# Patient Record
Sex: Female | Born: 1935 | Race: White | Hispanic: No | Smoking: Never smoker
Health system: Southern US, Community
[De-identification: ages and names within clinical notes are randomized; demographics above are authoritative.]

## PROBLEM LIST (undated history)

## (undated) DIAGNOSIS — N6489 Other specified disorders of breast: Secondary | ICD-10-CM

## (undated) DIAGNOSIS — IMO0002 Reserved for concepts with insufficient information to code with codable children: Secondary | ICD-10-CM

## (undated) DIAGNOSIS — E039 Hypothyroidism, unspecified: Secondary | ICD-10-CM

## (undated) DIAGNOSIS — K219 Gastro-esophageal reflux disease without esophagitis: Secondary | ICD-10-CM

## (undated) DIAGNOSIS — Z4689 Encounter for fitting and adjustment of other specified devices: Secondary | ICD-10-CM

## (undated) DIAGNOSIS — F419 Anxiety disorder, unspecified: Secondary | ICD-10-CM

## (undated) DIAGNOSIS — N816 Rectocele: Secondary | ICD-10-CM

## (undated) DIAGNOSIS — G20 Parkinson's disease: Secondary | ICD-10-CM

## (undated) DIAGNOSIS — M1990 Unspecified osteoarthritis, unspecified site: Secondary | ICD-10-CM

## (undated) DIAGNOSIS — G309 Alzheimer's disease, unspecified: Secondary | ICD-10-CM

## (undated) DIAGNOSIS — G20A1 Parkinson's disease without dyskinesia, without mention of fluctuations: Secondary | ICD-10-CM

## (undated) DIAGNOSIS — N3289 Other specified disorders of bladder: Secondary | ICD-10-CM

## (undated) DIAGNOSIS — M5030 Other cervical disc degeneration, unspecified cervical region: Secondary | ICD-10-CM

## (undated) DIAGNOSIS — Z87440 Personal history of urinary (tract) infections: Secondary | ICD-10-CM

## (undated) DIAGNOSIS — F0390 Unspecified dementia without behavioral disturbance: Secondary | ICD-10-CM

## (undated) DIAGNOSIS — D329 Benign neoplasm of meninges, unspecified: Secondary | ICD-10-CM

## (undated) DIAGNOSIS — R319 Hematuria, unspecified: Secondary | ICD-10-CM

## (undated) DIAGNOSIS — M810 Age-related osteoporosis without current pathological fracture: Secondary | ICD-10-CM

## (undated) DIAGNOSIS — I10 Essential (primary) hypertension: Secondary | ICD-10-CM

## (undated) DIAGNOSIS — D649 Anemia, unspecified: Secondary | ICD-10-CM

## (undated) DIAGNOSIS — F0280 Dementia in other diseases classified elsewhere without behavioral disturbance: Secondary | ICD-10-CM

## (undated) DIAGNOSIS — N952 Postmenopausal atrophic vaginitis: Secondary | ICD-10-CM

## (undated) HISTORY — DX: Essential (primary) hypertension: I10

## (undated) HISTORY — DX: Personal history of urinary (tract) infections: Z87.440

## (undated) HISTORY — DX: Rectocele: N81.6

## (undated) HISTORY — DX: Other specified disorders of bladder: N32.89

## (undated) HISTORY — DX: Other specified disorders of breast: N64.89

## (undated) HISTORY — DX: Unspecified osteoarthritis, unspecified site: M19.90

## (undated) HISTORY — DX: Encounter for fitting and adjustment of other specified devices: Z46.89

## (undated) HISTORY — DX: Gastro-esophageal reflux disease without esophagitis: K21.9

## (undated) HISTORY — PX: OTHER SURGICAL HISTORY: SHX169

## (undated) HISTORY — PX: TOTAL THYROIDECTOMY: SHX2547

## (undated) HISTORY — DX: Parkinson's disease: G20

## (undated) HISTORY — DX: Parkinson's disease without dyskinesia, without mention of fluctuations: G20.A1

## (undated) HISTORY — DX: Reserved for concepts with insufficient information to code with codable children: IMO0002

## (undated) HISTORY — DX: Postmenopausal atrophic vaginitis: N95.2

## (undated) HISTORY — PX: ABDOMINAL HYSTERECTOMY: SHX81

## (undated) HISTORY — PX: CARPAL TUNNEL RELEASE: SHX101

## (undated) HISTORY — DX: Hematuria, unspecified: R31.9

## (undated) HISTORY — DX: Anxiety disorder, unspecified: F41.9

## (undated) HISTORY — DX: Age-related osteoporosis without current pathological fracture: M81.0

---

## 2003-03-13 ENCOUNTER — Other Ambulatory Visit: Payer: Self-pay

## 2004-02-22 ENCOUNTER — Ambulatory Visit: Payer: Self-pay | Admitting: Internal Medicine

## 2004-10-10 ENCOUNTER — Ambulatory Visit: Payer: Self-pay | Admitting: Urology

## 2004-10-16 ENCOUNTER — Ambulatory Visit: Payer: Self-pay | Admitting: Gastroenterology

## 2004-10-18 ENCOUNTER — Ambulatory Visit: Payer: Self-pay | Admitting: Obstetrics and Gynecology

## 2004-11-07 ENCOUNTER — Inpatient Hospital Stay: Payer: Self-pay | Admitting: Obstetrics and Gynecology

## 2004-12-28 ENCOUNTER — Ambulatory Visit: Payer: Self-pay | Admitting: Neurology

## 2005-03-06 ENCOUNTER — Ambulatory Visit: Payer: Self-pay | Admitting: Internal Medicine

## 2005-03-13 ENCOUNTER — Ambulatory Visit: Payer: Self-pay | Admitting: Internal Medicine

## 2005-08-06 ENCOUNTER — Ambulatory Visit: Payer: Self-pay | Admitting: Cardiology

## 2006-03-12 ENCOUNTER — Ambulatory Visit: Payer: Self-pay | Admitting: Internal Medicine

## 2006-05-07 ENCOUNTER — Ambulatory Visit: Payer: Self-pay | Admitting: Internal Medicine

## 2006-05-12 ENCOUNTER — Ambulatory Visit: Payer: Self-pay | Admitting: Internal Medicine

## 2006-12-01 ENCOUNTER — Emergency Department: Payer: Self-pay | Admitting: Emergency Medicine

## 2007-01-11 ENCOUNTER — Emergency Department: Payer: Self-pay | Admitting: Emergency Medicine

## 2007-03-17 ENCOUNTER — Ambulatory Visit: Payer: Self-pay | Admitting: Internal Medicine

## 2008-03-17 ENCOUNTER — Ambulatory Visit: Payer: Self-pay | Admitting: Internal Medicine

## 2009-03-20 ENCOUNTER — Ambulatory Visit: Payer: Self-pay | Admitting: Internal Medicine

## 2010-03-12 ENCOUNTER — Other Ambulatory Visit: Payer: Self-pay | Admitting: Neurology

## 2010-03-13 ENCOUNTER — Ambulatory Visit: Payer: Self-pay | Admitting: Neurology

## 2010-03-22 ENCOUNTER — Ambulatory Visit: Payer: Self-pay | Admitting: Internal Medicine

## 2010-03-28 ENCOUNTER — Ambulatory Visit: Payer: Self-pay | Admitting: Ophthalmology

## 2010-04-18 ENCOUNTER — Ambulatory Visit: Payer: Self-pay | Admitting: Ophthalmology

## 2010-11-02 ENCOUNTER — Ambulatory Visit: Payer: Self-pay | Admitting: Internal Medicine

## 2011-03-25 ENCOUNTER — Ambulatory Visit: Payer: Self-pay | Admitting: Internal Medicine

## 2011-05-14 ENCOUNTER — Ambulatory Visit: Payer: Self-pay | Admitting: Internal Medicine

## 2012-03-03 ENCOUNTER — Ambulatory Visit: Payer: Self-pay | Admitting: Internal Medicine

## 2012-11-26 ENCOUNTER — Ambulatory Visit: Payer: Self-pay | Admitting: Neurology

## 2012-11-26 LAB — CREATININE, SERUM: EGFR (Non-African Amer.): 46 — ABNORMAL LOW

## 2013-04-06 ENCOUNTER — Ambulatory Visit: Payer: Self-pay | Admitting: Family Medicine

## 2013-09-03 DIAGNOSIS — K219 Gastro-esophageal reflux disease without esophagitis: Secondary | ICD-10-CM | POA: Insufficient documentation

## 2013-09-03 DIAGNOSIS — E039 Hypothyroidism, unspecified: Secondary | ICD-10-CM | POA: Insufficient documentation

## 2013-09-03 DIAGNOSIS — I10 Essential (primary) hypertension: Secondary | ICD-10-CM | POA: Insufficient documentation

## 2013-09-03 DIAGNOSIS — E785 Hyperlipidemia, unspecified: Secondary | ICD-10-CM | POA: Insufficient documentation

## 2013-09-10 DIAGNOSIS — G20 Parkinson's disease: Secondary | ICD-10-CM | POA: Insufficient documentation

## 2013-09-10 DIAGNOSIS — R9402 Abnormal brain scan: Secondary | ICD-10-CM | POA: Insufficient documentation

## 2013-09-10 DIAGNOSIS — R413 Other amnesia: Secondary | ICD-10-CM | POA: Insufficient documentation

## 2013-09-10 DIAGNOSIS — R262 Difficulty in walking, not elsewhere classified: Secondary | ICD-10-CM | POA: Insufficient documentation

## 2014-05-02 ENCOUNTER — Ambulatory Visit: Payer: Self-pay | Admitting: Family Medicine

## 2014-09-30 ENCOUNTER — Other Ambulatory Visit: Payer: Self-pay

## 2014-09-30 ENCOUNTER — Emergency Department: Payer: Medicare Other

## 2014-09-30 ENCOUNTER — Encounter: Payer: Self-pay | Admitting: Emergency Medicine

## 2014-09-30 ENCOUNTER — Emergency Department
Admission: EM | Admit: 2014-09-30 | Discharge: 2014-09-30 | Disposition: A | Discharge status: Home / Self Care | Payer: Medicare Other | Attending: Emergency Medicine | Admitting: Emergency Medicine

## 2014-09-30 DIAGNOSIS — Z79899 Other long term (current) drug therapy: Secondary | ICD-10-CM | POA: Diagnosis not present

## 2014-09-30 DIAGNOSIS — Y998 Other external cause status: Secondary | ICD-10-CM | POA: Diagnosis not present

## 2014-09-30 DIAGNOSIS — I10 Essential (primary) hypertension: Secondary | ICD-10-CM | POA: Insufficient documentation

## 2014-09-30 DIAGNOSIS — S6991XA Unspecified injury of right wrist, hand and finger(s), initial encounter: Secondary | ICD-10-CM | POA: Diagnosis not present

## 2014-09-30 DIAGNOSIS — S0990XA Unspecified injury of head, initial encounter: Secondary | ICD-10-CM | POA: Insufficient documentation

## 2014-09-30 DIAGNOSIS — Z7982 Long term (current) use of aspirin: Secondary | ICD-10-CM | POA: Diagnosis not present

## 2014-09-30 DIAGNOSIS — R4182 Altered mental status, unspecified: Secondary | ICD-10-CM | POA: Diagnosis present

## 2014-09-30 DIAGNOSIS — G20 Parkinson's disease: Secondary | ICD-10-CM | POA: Diagnosis not present

## 2014-09-30 DIAGNOSIS — W01198A Fall on same level from slipping, tripping and stumbling with subsequent striking against other object, initial encounter: Secondary | ICD-10-CM | POA: Diagnosis not present

## 2014-09-30 DIAGNOSIS — Y9389 Activity, other specified: Secondary | ICD-10-CM | POA: Diagnosis not present

## 2014-09-30 DIAGNOSIS — F0390 Unspecified dementia without behavioral disturbance: Secondary | ICD-10-CM | POA: Diagnosis not present

## 2014-09-30 DIAGNOSIS — Y9289 Other specified places as the place of occurrence of the external cause: Secondary | ICD-10-CM | POA: Diagnosis not present

## 2014-09-30 LAB — COMPREHENSIVE METABOLIC PANEL
ALT: 5 U/L — AB (ref 14–54)
AST: 18 U/L (ref 15–41)
Albumin: 3.8 g/dL (ref 3.5–5.0)
Alkaline Phosphatase: 50 U/L (ref 38–126)
Anion gap: 7 (ref 5–15)
BUN: 28 mg/dL — ABNORMAL HIGH (ref 6–20)
CHLORIDE: 101 mmol/L (ref 101–111)
CO2: 27 mmol/L (ref 22–32)
Calcium: 9 mg/dL (ref 8.9–10.3)
Creatinine, Ser: 1.07 mg/dL — ABNORMAL HIGH (ref 0.44–1.00)
GFR calc Af Amer: 53 mL/min — ABNORMAL LOW (ref >60)
GFR calc non Af Amer: 45 mL/min — ABNORMAL LOW (ref >60)
Glucose, Bld: 95 mg/dL (ref 65–99)
POTASSIUM: 4 mmol/L (ref 3.5–5.1)
SODIUM: 135 mmol/L (ref 135–145)
Total Bilirubin: 0.4 mg/dL (ref 0.3–1.2)
Total Protein: 7.1 g/dL (ref 6.5–8.1)

## 2014-09-30 LAB — URINALYSIS COMPLETE WITH MICROSCOPIC (ARMC ONLY)
Bacteria, UA: NONE SEEN
Bilirubin Urine: NEGATIVE
GLUCOSE, UA: NEGATIVE mg/dL
Hgb urine dipstick: NEGATIVE
Ketones, ur: NEGATIVE mg/dL
Nitrite: NEGATIVE
Protein, ur: NEGATIVE mg/dL
RBC / HPF: NONE SEEN RBC/hpf (ref 0–5)
Specific Gravity, Urine: 1.013 (ref 1.005–1.030)
pH: 6 (ref 5.0–8.0)

## 2014-09-30 LAB — CBC
HCT: 36.4 % (ref 35.0–47.0)
Hemoglobin: 11.8 g/dL — ABNORMAL LOW (ref 12.0–16.0)
MCH: 32.5 pg (ref 26.0–34.0)
MCHC: 32.5 g/dL (ref 32.0–36.0)
MCV: 99.9 fL (ref 80.0–100.0)
Platelets: 268 10*3/uL (ref 150–440)
RBC: 3.64 MIL/uL — ABNORMAL LOW (ref 3.80–5.20)
RDW: 13.1 % (ref 11.5–14.5)
WBC: 8.6 10*3/uL (ref 3.6–11.0)

## 2014-09-30 NOTE — Discharge Instructions (Signed)

## 2014-09-30 NOTE — ED Notes (Signed)
When asking pt questions she laughs a lot first, p[leasant confused

## 2014-09-30 NOTE — ED Notes (Signed)
Patient transported to CT 

## 2014-09-30 NOTE — ED Provider Notes (Signed)
[facility] Emergency Department Provider Note  ____________________________________________  Time seen: 5:25 PM  I have reviewed the triage vital signs and the nursing notes.   HISTORY  Chief Complaint Fall; Arm Pain; and Altered Mental Status    HPI Tina Gilbert is a 81 y.o. female is brought to the ED by her family due to confusion. They note that she fell and hit her head about 5 days ago. They've been following up with neurology Dr. Melrose Nakayama who started the patient on Namenda due to a decrease in her Mini-Mental status exam and dementia. The patient has no new complaints the chest pain shortness of breath fever chills nausea vomiting abdominal pain dysuria frequency urgency hematuria. She is eating and drinking normally.     Past Medical History  Diagnosis Date  . Hypertension   . Osteoporosis   . GERD (gastroesophageal reflux disease)   . Anxiety   . History of UTI   . Bladder irritability   . Asymmetrical breasts   . Hematuria   . Rectocele     moderate  . Pessary maintenance   . Cystocele     gellhorn 2 1/4   . Vaginal atrophy   . Parkinson disease     There are no active problems to display for this patient.   Past Surgical History  Procedure Laterality Date  . Colon surgery      Current Outpatient Rx  Name  Route  Sig  Dispense  Refill  . ALPRAZolam (XANAX) 0.25 MG tablet   Oral   Take 0.25 mg by mouth 4 (four) times daily as needed for anxiety.          Marland Kitchen aspirin EC 81 MG tablet   Oral   Take 81 mg by mouth daily.         Marland Kitchen atenolol (TENORMIN) 50 MG tablet   Oral   Take 50 mg by mouth daily.         . carbidopa-levodopa (SINEMET CR) 50-200 MG per tablet   Oral   Take 1 tablet by mouth 2 (two) times daily.         . Cholecalciferol (VITAMIN D) 2000 UNITS tablet   Oral   Take 2,000 Units by mouth daily.         . citalopram (CELEXA) 20 MG tablet   Oral   Take 20 mg by mouth daily.         Marland Kitchen  conjugated estrogens (PREMARIN) vaginal cream   Vaginal   Place 1 Applicatorful vaginally See admin instructions. Pt uses twice a week on Tuesday and Saturday.         . donepezil (ARICEPT) 5 MG tablet   Oral   Take 5 mg by mouth daily.          Marland Kitchen levothyroxine (SYNTHROID, LEVOTHROID) 125 MCG tablet   Oral   Take 125 mcg by mouth daily.         . memantine (NAMENDA) 5 MG tablet   Oral   Take 5 mg by mouth 2 (two) times daily.         Marland Kitchen omeprazole (PRILOSEC) 20 MG capsule   Oral   Take 20 mg by mouth daily.         . Oxybutynin Chloride 10 % GEL   Transdermal   Place 1 application onto the skin daily.          Levin Erp SULFATE VAGINAL 0.025 % GEL   Vaginal  Place 1 Applicatorful vaginally once a week. Pt uses on Saturday.         . triamterene-hydrochlorothiazide (MAXZIDE-25) 37.5-25 MG per tablet   Oral   Take 0.5 tablets by mouth daily.           Allergies Avelox; Celebrex; and Tramadol  No family history on file.  Social History History  Substance Use Topics  . Smoking status: Never Smoker   . Smokeless tobacco: Not on file  . Alcohol Use: No    Review of Systems  Constitutional: No fever or chills. No weight changes Eyes:No blurry vision or double vision.  ENT: No sore throat. Cardiovascular: No chest pain. Respiratory: No dyspnea or cough. Gastrointestinal: Negative for abdominal pain, vomiting and diarrhea.  No BRBPR or melena. Genitourinary: Negative for dysuria, urinary retention, bloody urine, or difficulty urinating. Musculoskeletal: Right wrist pain after fall  Skin: Negative for rash. Neurological: Negative for headaches, focal weakness or numbness. Psychiatric:No anxiety or depression.   Endocrine:No hot/cold intolerance, changes in energy, or sleep difficulty.  10-point ROS otherwise negative.  ____________________________________________   PHYSICAL EXAM:  VITAL SIGNS: ED Triage Vitals  Enc Vitals Group      BP 09/30/14 1215 112/56 mmHg     Pulse Rate 09/30/14 1215 73     Resp 09/30/14 1215 18     Temp 09/30/14 1215 98.5 F (36.9 C)     Temp Source 09/30/14 1215 Oral     SpO2 09/30/14 1215 96 %     Weight 09/30/14 1215 160 lb (72.576 kg)     Height 09/30/14 1215 5\' 1"  (1.549 m)     Head Cir --      Peak Flow --      Pain Score 09/30/14 1945 6     Pain Loc --      Pain Edu? --      Excl. in [place]? --      Constitutional: Alert and oriented. Well appearing and in no distress. Eyes: No scleral icterus. No conjunctival pallor. PERRL. EOMI ENT   Head: Normocephalic and atraumatic.   Nose: No congestion/rhinnorhea. No septal hematoma   Mouth/Throat: MMM, no pharyngeal erythema. No peritonsillar mass. No uvula shift.   Neck: No stridor. No SubQ emphysema. No meningismus. Hematological/Lymphatic/Immunilogical: No cervical lymphadenopathy. Cardiovascular: RRR. Normal and symmetric distal pulses are present in all extremities. No murmurs, rubs, or gallops. Respiratory: Normal respiratory effort without tachypnea nor retractions. Breath sounds are clear and equal bilaterally. No wheezes/rales/rhonchi. Gastrointestinal: Soft and nontender. No distention. There is no CVA tenderness.  No rebound, rigidity, or guarding. Genitourinary: deferred Musculoskeletal: Nontender with normal range of motion in all extremities. No joint effusions.  No lower extremity tenderness.  No edema. Neurologic:   Normal speech and language. Oriented to person and place CN 2-10 normal. Motor grossly intact. No pronator drift.  Normal gait. No gross focal neurologic deficits are appreciated.  Skin:  Skin is warm, dry and intact. No rash noted.  No petechiae, purpura, or bullae. Psychiatric: Mood and affect are normal. ____________________________________________    LABS (pertinent positives/negatives) (all labs ordered are listed, but only abnormal results are displayed) Labs Reviewed  CBC - Abnormal;  Notable for the following:    RBC 3.64 (*)    Hemoglobin 11.8 (*)    All other components within normal limits  COMPREHENSIVE METABOLIC PANEL - Abnormal; Notable for the following:    BUN 28 (*)    Creatinine, Ser 1.07 (*)    ALT 5 (*)  GFR calc non Af Amer 45 (*)    GFR calc Af Amer 53 (*)    All other components within normal limits  URINALYSIS COMPLETEWITH MICROSCOPIC ([facility] ONLY) - Abnormal; Notable for the following:    Color, Urine YELLOW (*)    APPearance CLEAR (*)    Leukocytes, UA TRACE (*)    Squamous Epithelial / LPF 0-5 (*)    All other components within normal limits  CBG MONITORING, ED   ____________________________________________   EKG  Interpreted by me Normal sinus rhythm rate of 67 normal axis left bundle branch block normal ST segments and T waves  ____________________________________________    RADIOLOGY  CT head unremarkable Chest x-ray unremarkable X-ray right wrist unremarkable  ____________________________________________   PROCEDURES  ____________________________________________   INITIAL IMPRESSION / ASSESSMENT AND PLAN / ED COURSE  Pertinent labs & imaging results that were available during my care of the patient were reviewed by me and considered in my medical decision making (see chart for details).  Patient presents with confusion in the setting of chronic dementia. However, it seems that the patient's confusion is an dementia are progressing more rapidly than expected. Due to her recent fall check a CT head to rule out intracranial hematoma as well as evaluate for occult sources of infection. I was suspicion of meningitis or encephalitis. We'll check a UA chest x-ray and labs CT head and x-rays related injuries.  ----------------------------------------- 8:31 PM on 09/30/2014 -----------------------------------------  Workup unremarkable. Patient in good spirits feeling well again no complaints and hemodynamically stable. We'll  discharge home continue all home medications follow-up with Dr. Melrose Nakayama.  ____________________________________________   FINAL CLINICAL IMPRESSION(S) / ED DIAGNOSES  Final diagnoses:  Dementia, without behavioral disturbance      Carrie Mew, MD 09/30/14 2031

## 2014-09-30 NOTE — ED Notes (Signed)
[place] backwards few days ago injuring right hand, has dementia, last few days her memory has decreased.  Accidentally taking too many meds,lives at home alone, daughters in room, some neck tenderness, has had occult blood in stools, is scheduled to see GI md in a few weeks

## 2014-09-30 NOTE — ED Notes (Signed)
Pt reports fell yesterday and hurt her right wrist. Slight swelling noted. Full ROM noted, pulse present. Pt daughter reports that it was 2 days ago that she fell not yesterday. Daughter reports pt being treated for her cognitive decline over the last 6-8 months, started a new medication 2 days ago and her confusion is worse. Pt reports called PMD and was advised to come to the ED for evaluation.

## 2014-10-19 ENCOUNTER — Ambulatory Visit (INDEPENDENT_AMBULATORY_CARE_PROVIDER_SITE_OTHER): Payer: Medicare Other | Admitting: Obstetrics and Gynecology

## 2014-10-19 ENCOUNTER — Encounter: Payer: Self-pay | Admitting: Obstetrics and Gynecology

## 2014-10-19 VITALS — BP 107/66 | HR 67 | Ht 63.0 in | Wt 155.7 lb

## 2014-10-19 DIAGNOSIS — N8110 Cystocele, unspecified: Secondary | ICD-10-CM | POA: Diagnosis not present

## 2014-10-19 DIAGNOSIS — Z4689 Encounter for fitting and adjustment of other specified devices: Secondary | ICD-10-CM | POA: Diagnosis not present

## 2014-10-19 DIAGNOSIS — IMO0002 Reserved for concepts with insufficient information to code with codable children: Secondary | ICD-10-CM

## 2014-10-19 DIAGNOSIS — N816 Rectocele: Secondary | ICD-10-CM

## 2014-10-19 NOTE — Progress Notes (Signed)
Patient ID: Tina Gilbert, female   DOB: Aug 31, 1924, 81 y.o.   MRN: [id number]     GYN ENCOUNTER NOTE  Subjective:       Tina Gilbert is a 81 y.o. No obstetric history on file. female is here for gynecologic evaluation of the following issues:  1. Pessary maintenance  Pessary check lv- 09/08/2014 gellhorn 2 1/4 Vaginal d/c- pos  vaginal odor- no No vb, or vaginal pain Premarin and trimosan used weekly Pos bm qd Urination- frequent   Gynecologic History No LMP recorded. Patient has had a hysterectomy. Contraception: status post hysterectomy   Obstetric History OB History  No data available    Past Medical History  Diagnosis Date  . Hypertension   . Osteoporosis   . GERD (gastroesophageal reflux disease)   . Anxiety   . History of UTI   . Bladder irritability   . Asymmetrical breasts   . Hematuria   . Rectocele     moderate  . Pessary maintenance   . Cystocele     gellhorn 2 1/4   . Vaginal atrophy   . Parkinson disease     Past Surgical History  Procedure Laterality Date  . Colon surgery      Current Outpatient Prescriptions on File Prior to Visit  Medication Sig Dispense Refill  . ALPRAZolam (XANAX) 0.25 MG tablet Take 0.25 mg by mouth 4 (four) times daily as needed for anxiety.     Marland Kitchen aspirin EC 81 MG tablet Take 81 mg by mouth daily.    Marland Kitchen atenolol (TENORMIN) 50 MG tablet Take 50 mg by mouth daily.    . carbidopa-levodopa (SINEMET CR) 50-200 MG per tablet Take 1 tablet by mouth 2 (two) times daily.    . Cholecalciferol (VITAMIN D) 2000 UNITS tablet Take 2,000 Units by mouth daily.    . citalopram (CELEXA) 20 MG tablet Take 20 mg by mouth daily.    Marland Kitchen conjugated estrogens (PREMARIN) vaginal cream Place 1 Applicatorful vaginally See admin instructions. Pt uses twice a week on Tuesday and Saturday.    . levothyroxine (SYNTHROID, LEVOTHROID) 125 MCG tablet Take 125 mcg by mouth daily.    Marland Kitchen omeprazole (PRILOSEC) 20 MG capsule Take 20 mg by mouth daily.     Levin Erp SULFATE VAGINAL 0.025 % GEL Place 1 Applicatorful vaginally once a week. Pt uses on Saturday.    . triamterene-hydrochlorothiazide (MAXZIDE-25) 37.5-25 MG per tablet Take 0.5 tablets by mouth daily.     No current facility-administered medications on file prior to visit.    Allergies  Allergen Reactions  . Avelox [Moxifloxacin Hcl In Nacl] Other (See Comments)    Reaction:  Positive ANA and bilateral iritis   . Celebrex [Celecoxib] Other (See Comments)    Reaction:  Positive ANA and bilateral iritis   . Memantine Hcl Other (See Comments)    confusion  . Tramadol Other (See Comments)    Reaction:  Hallucinations     History   Social History  . Marital Status: Widowed    Spouse Name: N/A  . Number of Children: N/A  . Years of Education: N/A   Occupational History  . Not on file.   Social History Main Topics  . Smoking status: Never Smoker   . Smokeless tobacco: Not on file  . Alcohol Use: No  . Drug Use: No  . Sexual Activity: No   Other Topics Concern  . Not on file   Social History Narrative    History  reviewed. No pertinent family history.  The following portions of the patient's history were reviewed and updated as appropriate: allergies, current medications, past family history, past medical history, past social history, past surgical history and problem list.  Review of Systems Review of Systems - General ROS: negative for - chills, fatigue, fever, hot flashes, malaise or night sweats Hematological and Lymphatic ROS: negative for - bleeding problems or swollen lymph nodes Gastrointestinal ROS: negative for - abdominal pain, blood in stools, change in bowel habits and nausea/vomiting Musculoskeletal ROS: negative for - joint pain, muscle pain or muscular weakness Genito-Urinary ROS: negative for -  dysuria, genital discharge, genital ulcers, hematuria, incontinence, nocturia or pelvic pain.  Objective:   BP 107/66 mmHg  Pulse 67  Ht 5\' 3"   (1.6 m)  Wt 155 lb 11.2 oz (70.625 kg)  BMI 27.59 kg/m2 CONSTITUTIONAL: Well-developed, well-nourished female in no acute distress.  HENT:  Normocephalic, atraumatic.  NECK: Normal range of motion, supple, no masses.  Normal thyroid.  SKIN: Skin is warm and dry. No rash noted. Not diaphoretic. No erythema. No pallor. Narcissa: Alert and oriented to person, place, and time. PSYCHIATRIC: Normal mood and affect. Normal behavior. Normal judgment and thought content. CARDIOVASCULAR:Not Examined RESPIRATORY: Not Examined BREASTS: Not Examined ABDOMEN: Soft, non distended; Non tender.  No Organomegaly. PELVIC:  External Genitalia: Normal  BUS: Normal  Vagina: Mild hyperemia of left vaginal sidewall without ulceration; atrophy present; second-degree rectocele present  Bladder: Nontender MUSCULOSKELETAL: Normal range of motion. No tenderness.  No cyanosis, clubbing, or edema.     Assessment:   1.  Pessary maintenance-stable. 2.  Cystocele/rectocele   Plan:  1.  Pessary is removed, cleaned, and replaced. 2.  Patient is to continue using [facility] and Premarin cream weekly. 3.  Return in 6 weeks for recheck.

## 2014-11-25 ENCOUNTER — Encounter: Payer: Self-pay | Admitting: *Deleted

## 2014-11-28 ENCOUNTER — Encounter
Admission: RE | Disposition: A | Discharge status: Home / Self Care | Payer: Self-pay | Source: Ambulatory Visit | Attending: Gastroenterology

## 2014-11-28 ENCOUNTER — Ambulatory Visit: Payer: Medicare Other | Admitting: Registered Nurse

## 2014-11-28 ENCOUNTER — Ambulatory Visit
Admission: RE | Admit: 2014-11-28 | Discharge: 2014-11-28 | Disposition: A | Discharge status: Home / Self Care | Payer: Medicare Other | Source: Ambulatory Visit | Attending: Gastroenterology | Admitting: Gastroenterology

## 2014-11-28 DIAGNOSIS — G20 Parkinson's disease: Secondary | ICD-10-CM | POA: Insufficient documentation

## 2014-11-28 DIAGNOSIS — Z800 Family history of malignant neoplasm of digestive organs: Secondary | ICD-10-CM | POA: Insufficient documentation

## 2014-11-28 DIAGNOSIS — Z8371 Family history of colonic polyps: Secondary | ICD-10-CM | POA: Insufficient documentation

## 2014-11-28 DIAGNOSIS — I10 Essential (primary) hypertension: Secondary | ICD-10-CM | POA: Diagnosis not present

## 2014-11-28 DIAGNOSIS — R195 Other fecal abnormalities: Secondary | ICD-10-CM | POA: Diagnosis present

## 2014-11-28 DIAGNOSIS — Z1211 Encounter for screening for malignant neoplasm of colon: Secondary | ICD-10-CM | POA: Diagnosis not present

## 2014-11-28 DIAGNOSIS — K219 Gastro-esophageal reflux disease without esophagitis: Secondary | ICD-10-CM | POA: Diagnosis not present

## 2014-11-28 DIAGNOSIS — K5730 Diverticulosis of large intestine without perforation or abscess without bleeding: Secondary | ICD-10-CM | POA: Insufficient documentation

## 2014-11-28 HISTORY — DX: Anemia, unspecified: D64.9

## 2014-11-28 HISTORY — DX: Benign neoplasm of meninges, unspecified: D32.9

## 2014-11-28 HISTORY — DX: Hypothyroidism, unspecified: E03.9

## 2014-11-28 HISTORY — PX: ESOPHAGOGASTRODUODENOSCOPY (EGD) WITH PROPOFOL: SHX5813

## 2014-11-28 HISTORY — PX: COLONOSCOPY WITH PROPOFOL: SHX5780

## 2014-11-28 HISTORY — DX: Other cervical disc degeneration, unspecified cervical region: M50.30

## 2014-11-28 SURGERY — COLONOSCOPY WITH PROPOFOL
Anesthesia: General

## 2014-11-28 MED ORDER — LIDOCAINE HCL (CARDIAC) 20 MG/ML IV SOLN
INTRAVENOUS | Status: DC | PRN
  Administered 2014-11-28: 40 mg via INTRAVENOUS

## 2014-11-28 MED ORDER — SODIUM CHLORIDE 0.9 % IV SOLN: INTRAVENOUS | Status: DC

## 2014-11-28 MED ORDER — PROPOFOL 10 MG/ML IV BOLUS
INTRAVENOUS | Status: DC | PRN
  Administered 2014-11-28: 50 mg via INTRAVENOUS

## 2014-11-28 MED ORDER — PROPOFOL INFUSION 10 MG/ML OPTIME
INTRAVENOUS | Status: DC | PRN
  Administered 2014-11-28: 140 ug/kg/min via INTRAVENOUS

## 2014-11-28 MED ORDER — SODIUM CHLORIDE 0.9 % IV SOLN
INTRAVENOUS | Status: DC
  Administered 2014-11-28: 08:00:00 via INTRAVENOUS

## 2014-11-28 MED ORDER — GLYCOPYRROLATE 0.2 MG/ML IJ SOLN
INTRAMUSCULAR | Status: DC | PRN
  Administered 2014-11-28: 0.2 mg via INTRAVENOUS

## 2014-11-28 NOTE — Op Note (Signed)
[facility] Gastroenterology Patient Name: Tina Gilbert Procedure Date: 11/28/2014 8:10 AM MRN: [id number] Account #: [id number] Date of Birth: Oct 30, 1924 Admit Type: Outpatient Age: 81 Room: [facility] ENDO ROOM 4 Gender: Female Note Status: Finalized Procedure:         Upper GI endoscopy Indications:       Heme positive stool Providers:         Lupita Dawn. Candace Cruise, MD Referring MD:      [name], MD (Referring MD) Medicines:         Monitored Anesthesia Care Complications:     No immediate complications. Procedure:         Pre-Anesthesia Assessment:                    - Prior to the procedure, a History and Physical was                     performed, and patient medications, allergies and                     sensitivities were reviewed. The patient's tolerance of                     previous anesthesia was reviewed.                    - The risks and benefits of the procedure and the sedation                     options and risks were discussed with the patient. All                     questions were answered and informed consent was obtained.                    - After reviewing the risks and benefits, the patient was                     deemed in satisfactory condition to undergo the procedure.                    After obtaining informed consent, the endoscope was passed                     under direct vision. Throughout the procedure, the                     patient's blood pressure, pulse, and oxygen saturations                     were monitored continuously. The Endoscope was introduced                     through the mouth, and advanced to the second part of                     duodenum. The upper GI endoscopy was accomplished without                     difficulty. The patient tolerated the procedure well. Findings:      The examined esophagus was normal.      The entire examined stomach was normal.      The examined duodenum was normal. Impression:        -  Normal esophagus.                    - Normal stomach.                    - Normal examined duodenum.                    - No specimens collected. Recommendation:    - Discharge patient to home.                    - Observe patient's clinical course.                    - The findings and recommendations were discussed with the                     patient. Procedure Code(s): --- Professional ---                    [phone], Esophagogastroduodenoscopy, flexible, transoral;                     diagnostic, including collection of specimen(s) by                     brushing or washing, when performed (separate procedure) Diagnosis Code(s): --- Professional ---                    R19.5, Other fecal abnormalities CPT copyright 2014 American Medical Association. All rights reserved. The codes documented in this report are preliminary and upon coder review may  be revised to meet current compliance requirements. Hulen Luster, MD 11/28/2014 8:19:41 AM This report has been signed electronically. Number of Addenda: 0 Note Initiated On: 11/28/2014 8:10 AM      [facility]

## 2014-11-28 NOTE — Consult Note (Signed)
Primary Care Physician:  No PCP Per Patient Primary Gastroenterologist:  Dr. Candace Cruise  Pre-Procedure History & Physical: HPI:  Tina Gilbert is a 81 y.o. female is here for an EGD/colon.   Past Medical History  Diagnosis Date  . Hypertension   . Osteoporosis   . GERD (gastroesophageal reflux disease)   . Anxiety   . History of UTI   . Bladder irritability   . Asymmetrical breasts   . Hematuria   . Rectocele     moderate  . Pessary maintenance   . Cystocele     gellhorn 2 1/4   . Vaginal atrophy   . Parkinson disease   . Hypothyroidism   . Anemia   . Meningioma   . DDD (degenerative disc disease), cervical     Past Surgical History  Procedure Laterality Date  . Colon surgery    . Abdominal hysterectomy    . Total thyroidectomy    . Carpal tunnel release Bilateral   . Ovarian mass removal      Prior to Admission medications   Medication Sig Start Date End Date Taking? Authorizing Provider  ALPRAZolam (XANAX) 0.25 MG tablet Take 0.25 mg by mouth 4 (four) times daily as needed for anxiety.    Yes Historical Provider, MD  aspirin EC 81 MG tablet Take 81 mg by mouth daily.   Yes Historical Provider, MD  atenolol (TENORMIN) 50 MG tablet Take 50 mg by mouth daily.   Yes Historical Provider, MD  carbidopa-levodopa (SINEMET CR) 50-200 MG per tablet Take 1 tablet by mouth 2 (two) times daily.   Yes Historical Provider, MD  Cholecalciferol (VITAMIN D) 2000 UNITS tablet Take 2,000 Units by mouth daily.   Yes Historical Provider, MD  citalopram (CELEXA) 20 MG tablet Take 20 mg by mouth daily.   Yes Historical Provider, MD  conjugated estrogens (PREMARIN) vaginal cream Place 1 Applicatorful vaginally See admin instructions. Pt uses twice a week on Tuesday and Saturday.   Yes Historical Provider, MD  donepezil (ARICEPT) 5 MG tablet TAKE ONE TABLET BY MOUTH NIGHTLY 10/13/14  Yes Historical Provider, MD  levothyroxine (SYNTHROID, LEVOTHROID) 125 MCG tablet Take 125 mcg by mouth daily.    Yes Historical Provider, MD  omeprazole (PRILOSEC) 20 MG capsule Take 20 mg by mouth daily.   Yes Historical Provider, MD  OXYQUINOLONE SULFATE VAGINAL 0.025 % GEL Place 1 Applicatorful vaginally once a week. Pt uses on Saturday.   Yes Historical Provider, MD  solifenacin (VESICARE) 5 MG tablet Take 5 mg by mouth daily.   Yes Historical Provider, MD  triamterene-hydrochlorothiazide (MAXZIDE-25) 37.5-25 MG per tablet Take 0.5 tablets by mouth daily.   Yes Historical Provider, MD    Allergies as of 10/26/2014 - Review Complete 10/19/2014  Allergen Reaction Noted  . Avelox [moxifloxacin hcl in nacl] Other (See Comments) 09/12/2014  . Celebrex [celecoxib] Other (See Comments) 09/12/2014  . Memantine hcl Other (See Comments) 10/19/2014  . Tramadol Other (See Comments) 09/30/2014    History reviewed. No pertinent family history.  History   Social History  . Marital Status: Widowed    Spouse Name: N/A  . Number of Children: N/A  . Years of Education: N/A   Occupational History  . Not on file.   Social History Main Topics  . Smoking status: Never Smoker   . Smokeless tobacco: Not on file  . Alcohol Use: No  . Drug Use: No  . Sexual Activity: No   Other Topics Concern  .  Not on file   Social History Narrative    Review of Systems: See HPI, otherwise negative ROS  Physical Exam: BP 135/58 mmHg  Pulse 65  Temp(Src) 97.5 F (36.4 C) (Oral)  Resp 20  Ht 5\' 3"  (1.6 m)  Wt 69.4 kg (153 lb)  BMI 27.11 kg/m2  SpO2 100% General:   Alert,  pleasant and cooperative in NAD Head:  Normocephalic and atraumatic. Neck:  Supple; no masses or thyromegaly. Lungs:  Clear throughout to auscultation.    Heart:  Regular rate and rhythm. Abdomen:  Soft, nontender and nondistended. Normal bowel sounds, without guarding, and without rebound.   Neurologic:  Alert and  oriented x4;  grossly normal neurologically.  Impression/Plan: Tina Gilbert is here for an EGD/colonoscopy to be  performed for heme positive stool, family hx of colon cancer and polyps.  Risks, benefits, limitations, and alternatives regarding EGD/colonoscopy have been reviewed with the patient.  Questions have been answered.  All parties agreeable.   Kayde Atkerson, Lupita Dawn, MD  11/28/2014, 8:05 AM

## 2014-11-28 NOTE — Anesthesia Preprocedure Evaluation (Signed)
Anesthesia Evaluation  Patient identified by MRN, date of birth, ID band Patient awake and Patient confused    Reviewed: Allergy & Precautions, NPO status , Patient's Chart, lab work & pertinent test results  History of Anesthesia Complications Negative for: history of anesthetic complications  Airway Mallampati: II  TM Distance: >3 FB Neck ROM: Full    Dental  (+) Teeth Intact   Pulmonary          Cardiovascular hypertension, Pt. on medications     Neuro/Psych Anxiety  Neuromuscular disease (Parkinson's dz)    GI/Hepatic GERD-  Medicated and Controlled,  Endo/Other    Renal/GU      Musculoskeletal  (+) Arthritis -, Osteoarthritis,    Abdominal   Peds  Hematology  (+) anemia ,   Anesthesia Other Findings   Reproductive/Obstetrics                             Anesthesia Physical Anesthesia Plan  ASA: III  Anesthesia Plan: General   Post-op Pain Management:    Induction: Intravenous  Airway Management Planned: Nasal Cannula  Additional Equipment:   Intra-op Plan:   Post-operative Plan:   Informed Consent: I have reviewed the patients History and Physical, chart, labs and discussed the procedure including the risks, benefits and alternatives for the proposed anesthesia with the patient or authorized representative who has indicated his/her understanding and acceptance.     Plan Discussed with:   Anesthesia Plan Comments:         Anesthesia Quick Evaluation

## 2014-11-28 NOTE — Anesthesia Procedure Notes (Signed)
Date/Time: 11/28/2014 8:08 AM Performed by: Doreen Salvage Pre-anesthesia Checklist: Patient identified, Emergency Drugs available, Suction available and Patient being monitored Patient Re-evaluated:Patient Re-evaluated prior to inductionOxygen Delivery Method: Nasal cannula

## 2014-11-28 NOTE — Anesthesia Postprocedure Evaluation (Signed)
  Anesthesia Post-op Note  Patient: Tina Gilbert  Procedure(s) Performed: Procedure(s): COLONOSCOPY WITH PROPOFOL (N/A) ESOPHAGOGASTRODUODENOSCOPY (EGD) WITH PROPOFOL (N/A)  Anesthesia type:General  Patient location: PACU  Post pain: Pain level controlled  Post assessment: Post-op Vital signs reviewed, Patient's Cardiovascular Status Stable, Respiratory Function Stable, Patent Airway and No signs of Nausea or vomiting  Post vital signs: Reviewed and stable  Last Vitals:  Filed Vitals:   11/28/14 0930  BP: 148/63  Pulse: 73  Temp:   Resp: 19    Level of consciousness: awake, alert  and patient cooperative  Complications: No apparent anesthesia complications

## 2014-11-28 NOTE — H&P (Signed)
  Date of Initial H&P: 10/19/2014  History reviewed, patient examined, no change in status, stable for surgery.

## 2014-11-28 NOTE — Transfer of Care (Signed)
Immediate Anesthesia Transfer of Care Note  Patient: Tina Gilbert  Procedure(s) Performed: Procedure(s): COLONOSCOPY WITH PROPOFOL (N/A) ESOPHAGOGASTRODUODENOSCOPY (EGD) WITH PROPOFOL (N/A)  Patient Location: PACU and Endoscopy Unit  Anesthesia Type:General  Level of Consciousness: sedated  Airway & Oxygen Therapy: Patient Spontanous Breathing and Patient connected to nasal cannula oxygen  Post-op Assessment: Report given to RN and Post -op Vital signs reviewed and stable  Post vital signs: Reviewed and stable  Last Vitals:  Filed Vitals:   11/28/14 0854  BP: 144/56  Pulse: 67  Temp: 35.8 C  Resp: 17    Complications: No apparent anesthesia complications

## 2014-11-28 NOTE — Op Note (Signed)
[facility] Patient Name: Tina Gilbert Procedure Date: 11/28/2014 8:10 AM MRN: [id number] Account #: [id number] Date of Birth: 1925-05-05 Admit Type: Outpatient Age: 81 Room: [facility] ENDO ROOM 4 Gender: Female Note Status: Finalized Procedure:         Colonoscopy Indications:       Heme positive stool, Family history of colon cancer in a                     first-degree relative, Family history of colonic polyps in                     a first-degree relative Providers:         Lupita Dawn. Candace Cruise, MD Referring MD:      [name], MD (Referring MD) Medicines:         Monitored Anesthesia Care Complications:     No immediate complications. Procedure:         Pre-Anesthesia Assessment:                    - Prior to the procedure, a History and Physical was                     performed, and patient medications, allergies and                     sensitivities were reviewed. The patient's tolerance of                     previous anesthesia was reviewed.                    - The risks and benefits of the procedure and the sedation                     options and risks were discussed with the patient. All                     questions were answered and informed consent was obtained.                    - After reviewing the risks and benefits, the patient was                     deemed in satisfactory condition to undergo the procedure.                    After obtaining informed consent, the colonoscope was                     passed under direct vision. Throughout the procedure, the                     patient's blood pressure, pulse, and oxygen saturations                     were monitored continuously. The Colonoscope was                     introduced through the anus with the intention of                     advancing to the cecum. The scope was advanced to the  sigmoid colon before the procedure was aborted.   Medications were given. The colonoscopy was unusually                     difficult due to restricted mobility of the colon. Findings:      Multiple small and large-mouthed diverticula were found in the sigmoid       colon. Limited mobility due to many tics. Unable to get scope beyond       using CF, pediscope, or gastroscope. Elected to stop.      The exam was otherwise without abnormality. Impression:        - Diverticulosis in the sigmoid colon.                    - The examination was otherwise normal.                    - No specimens collected. Recommendation:    - Discharge patient to home.                    - The findings and recommendations were discussed with the                     patient.                    - Recommend ACBE to evaluate colon. Procedure Code(s): --- Professional ---                    [phone], Sigmoidoscopy, flexible; diagnostic, including                     collection of specimen(s) by brushing or washing, when                     performed (separate procedure) Diagnosis Code(s): --- Professional ---                    R19.5, Other fecal abnormalities                    Z80.0, Family history of malignant neoplasm of digestive                     organs                    Z83.71, Family history of colonic polyps                    K57.30, Diverticulosis of large intestine without                     perforation or abscess without bleeding CPT copyright 2014 American Medical Association. All rights reserved. The codes documented in this report are preliminary and upon coder review may  be revised to meet current compliance requirements. Hulen Luster, MD 11/28/2014 8:51:33 AM This report has been signed electronically. Number of Addenda: 0 Note Initiated On: 11/28/2014 8:10 AM      [facility]

## 2014-11-29 ENCOUNTER — Encounter: Payer: Self-pay | Admitting: Gastroenterology

## 2014-11-30 ENCOUNTER — Telehealth: Payer: Self-pay

## 2014-11-30 ENCOUNTER — Ambulatory Visit (INDEPENDENT_AMBULATORY_CARE_PROVIDER_SITE_OTHER): Payer: Medicare Other | Admitting: Obstetrics and Gynecology

## 2014-11-30 ENCOUNTER — Encounter: Payer: Self-pay | Admitting: Obstetrics and Gynecology

## 2014-11-30 VITALS — BP 126/70 | HR 88 | Ht 63.0 in | Wt 157.5 lb

## 2014-11-30 DIAGNOSIS — IMO0002 Reserved for concepts with insufficient information to code with codable children: Secondary | ICD-10-CM

## 2014-11-30 DIAGNOSIS — Z4689 Encounter for fitting and adjustment of other specified devices: Secondary | ICD-10-CM

## 2014-11-30 DIAGNOSIS — N8110 Cystocele, unspecified: Secondary | ICD-10-CM | POA: Diagnosis not present

## 2014-11-30 DIAGNOSIS — N816 Rectocele: Secondary | ICD-10-CM

## 2014-11-30 DIAGNOSIS — R350 Frequency of micturition: Secondary | ICD-10-CM | POA: Diagnosis not present

## 2014-11-30 DIAGNOSIS — N814 Uterovaginal prolapse, unspecified: Secondary | ICD-10-CM | POA: Diagnosis not present

## 2014-11-30 LAB — POCT URINALYSIS DIPSTICK
Bilirubin, UA: NEGATIVE
Blood, UA: NEGATIVE
Glucose, UA: NEGATIVE
Ketones, UA: NEGATIVE
NITRITE UA: NEGATIVE
PH UA: 6
PROTEIN UA: NEGATIVE
Spec Grav, UA: 1.02
UROBILINOGEN UA: NEGATIVE

## 2014-11-30 NOTE — Progress Notes (Signed)
Patient ID: Tina Gilbert, female   DOB: [birth year], 81 y.o.   MRN: [id number]   Chief complaint: 1.  Pessary maintenance.   6 week pessary f/u Last visit: 10/19/2014 gellhorn 2 1/4 No discharge No odor No vaginal bleeding or pain Daughter assist with Premarin Cr. And Trimosan Has BM every day Denies any UTI symptons except urinary frequency but daughter would like urine checked.  OBJECTIVE: BP 126/70 mmHg  Pulse 88  Ht 5\' 3"  (1.6 m)  Wt 157 lb 8 oz (71.442 kg)  BMI 27.91 kg/m2 Pleasant elderly female in no acute distress. Abdomen: Soft, nontender. Pelvic exam: External genitalia-normal BUS: Normal Vagina: No ulcerations. Cervix: No lesions. Uterus: Non-tender, normal size. Adnexa: Nontender, nonpalpable. RV: Normal external exam.  Procedure: Gellhorn pessary is removed, cleaned, and reinserted.  IMPRESSION: 1.  Normal pessary maintenance. 2.  Cystocele, rectocele, uterine prolapse.  PLAN: 1.  Pessary removed, cleaned and reinserted. 2.  Continue with TRIMO San gelAnd Premarin cream. 3.  Return in 6 weeks for recheck

## 2014-11-30 NOTE — Patient Instructions (Signed)
All good. Recheck in 6 weeks.

## 2014-11-30 NOTE — Telephone Encounter (Signed)
Tried to contact pt's daughter about her visit today.. Pt urinalysis was +small leuks, otherwise unremarkable. Urine culture sent.  Will try to contact later.

## 2014-12-02 DIAGNOSIS — IMO0002 Reserved for concepts with insufficient information to code with codable children: Secondary | ICD-10-CM | POA: Insufficient documentation

## 2014-12-02 DIAGNOSIS — N816 Rectocele: Secondary | ICD-10-CM | POA: Insufficient documentation

## 2014-12-02 DIAGNOSIS — N814 Uterovaginal prolapse, unspecified: Secondary | ICD-10-CM | POA: Insufficient documentation

## 2014-12-02 LAB — URINE CULTURE

## 2014-12-02 NOTE — Telephone Encounter (Signed)
-----   Message from Brayton Mars, MD sent at 12/02/2014  7:25 AM EDT ----- Please Notify - Labs normal

## 2014-12-02 NOTE — Telephone Encounter (Signed)
Daughter informed of visit information and urine culture negative. Colonoscopy limited due to Diverticulosis.

## 2015-01-11 ENCOUNTER — Encounter: Payer: Self-pay | Admitting: Obstetrics and Gynecology

## 2015-01-11 ENCOUNTER — Ambulatory Visit (INDEPENDENT_AMBULATORY_CARE_PROVIDER_SITE_OTHER): Payer: Medicare Other | Admitting: Obstetrics and Gynecology

## 2015-01-11 ENCOUNTER — Other Ambulatory Visit: Payer: Self-pay | Admitting: Obstetrics and Gynecology

## 2015-01-11 VITALS — BP 115/63 | HR 74 | Ht 65.0 in | Wt 159.0 lb

## 2015-01-11 DIAGNOSIS — N8110 Cystocele, unspecified: Secondary | ICD-10-CM | POA: Diagnosis not present

## 2015-01-11 DIAGNOSIS — IMO0002 Reserved for concepts with insufficient information to code with codable children: Secondary | ICD-10-CM

## 2015-01-11 DIAGNOSIS — N814 Uterovaginal prolapse, unspecified: Secondary | ICD-10-CM | POA: Diagnosis not present

## 2015-01-11 DIAGNOSIS — N816 Rectocele: Secondary | ICD-10-CM | POA: Diagnosis not present

## 2015-01-11 DIAGNOSIS — R3915 Urgency of urination: Secondary | ICD-10-CM | POA: Diagnosis not present

## 2015-01-11 DIAGNOSIS — Z4689 Encounter for fitting and adjustment of other specified devices: Secondary | ICD-10-CM

## 2015-01-11 LAB — POCT URINALYSIS DIPSTICK
Bilirubin, UA: NEGATIVE
Blood, UA: NEGATIVE
Glucose, UA: NEGATIVE
Ketones, UA: NEGATIVE
LEUKOCYTES UA: NEGATIVE
NITRITE UA: NEGATIVE
PH UA: 6
PROTEIN UA: NEGATIVE
Spec Grav, UA: 1.015
UROBILINOGEN UA: 0.2

## 2015-01-11 NOTE — Progress Notes (Signed)
Patient ID: Tina Gilbert, female   DOB: 1924/08/15, 81 y.o.   MRN: [id number] 6 week pessary check No vb,vd, or odor  Chief complaint: 1.  Pessary maintenance 2.  Pelvic organ prolapse (cystocele, rectocele, uterine prolapse) 3.UTI symptoms    6 week pessary f/u Last visit: 12/02/2014 gellhorn 2 1/4 No discharge No odor No vaginal bleeding or pain Daughter assist with Premarin Cr. And Trimosan Has BM every day Denies any UTI symptons except urinary frequency but Daughter would like urinalysis and culture sent.   OBJECTIVE: BP 115/63 mmHg  Pulse 74  Ht 5\' 5"  (1.651 m)  Wt 159 lb (72.122 kg)  BMI 26.46 kg/m2 Pleasant elderly female in no acute distress. Abdomen: Soft, nontender. Pelvic exam: External genitalia-normal BUS: Normal Vagina: No ulcerations;Mild malodor; no discharge Cervix: No lesions. Uterus: Non-tender, normal size. Adnexa: Nontender, nonpalpable. RV: Normal external exam.  Procedure: Gellhorn pessary is removed, cleaned, and reinserted.  IMPRESSION: 1. Normal pessary maintenance. 2. Cystocele, rectocele, uterine prolapse.  PLAN: 1. Pessary removed, cleaned and reinserted. 2. Continue with TRIMO San gelAnd Premarin cream. 3. Return in 6 weeks for recheck  A total of 15 minutes were spent face-to-face with the patient during this encounter and over half of that time dealt with counseling and coordination of care.  Brayton Mars, MD

## 2015-01-11 NOTE — Patient Instructions (Signed)
1.  The gelhorn Pessary is removed, cleaned and replaced. 2.  UA with CNS is obtained. 3.  Return in 6 weeks for recheck.

## 2015-01-13 LAB — URINE CULTURE

## 2015-01-17 ENCOUNTER — Other Ambulatory Visit: Payer: Self-pay | Admitting: Neurology

## 2015-01-17 DIAGNOSIS — R296 Repeated falls: Secondary | ICD-10-CM

## 2015-01-17 DIAGNOSIS — R410 Disorientation, unspecified: Secondary | ICD-10-CM

## 2015-01-17 DIAGNOSIS — R413 Other amnesia: Secondary | ICD-10-CM

## 2015-01-24 ENCOUNTER — Ambulatory Visit
Admission: RE | Admit: 2015-01-24 | Discharge: 2015-01-24 | Disposition: A | Discharge status: Home / Self Care | Payer: Medicare Other | Source: Ambulatory Visit | Attending: Neurology | Admitting: Neurology

## 2015-01-24 DIAGNOSIS — G319 Degenerative disease of nervous system, unspecified: Secondary | ICD-10-CM | POA: Diagnosis not present

## 2015-01-24 DIAGNOSIS — R410 Disorientation, unspecified: Secondary | ICD-10-CM

## 2015-01-24 DIAGNOSIS — M4802 Spinal stenosis, cervical region: Secondary | ICD-10-CM | POA: Diagnosis not present

## 2015-01-24 DIAGNOSIS — R413 Other amnesia: Secondary | ICD-10-CM

## 2015-01-24 DIAGNOSIS — I739 Peripheral vascular disease, unspecified: Secondary | ICD-10-CM | POA: Diagnosis not present

## 2015-01-24 DIAGNOSIS — R296 Repeated falls: Secondary | ICD-10-CM | POA: Diagnosis present

## 2015-01-24 DIAGNOSIS — D329 Benign neoplasm of meninges, unspecified: Secondary | ICD-10-CM | POA: Diagnosis not present

## 2015-01-30 ENCOUNTER — Other Ambulatory Visit: Payer: Self-pay | Admitting: Obstetrics and Gynecology

## 2015-02-22 ENCOUNTER — Ambulatory Visit (INDEPENDENT_AMBULATORY_CARE_PROVIDER_SITE_OTHER): Payer: Medicare Other | Admitting: Obstetrics and Gynecology

## 2015-02-22 ENCOUNTER — Ambulatory Visit: Payer: Medicare Other

## 2015-02-22 ENCOUNTER — Encounter: Payer: Self-pay | Admitting: Obstetrics and Gynecology

## 2015-02-22 VITALS — BP 143/71 | HR 70 | Ht 65.0 in | Wt 160.2 lb

## 2015-02-22 DIAGNOSIS — Z4689 Encounter for fitting and adjustment of other specified devices: Secondary | ICD-10-CM | POA: Diagnosis not present

## 2015-02-22 DIAGNOSIS — R1900 Intra-abdominal and pelvic swelling, mass and lump, unspecified site: Secondary | ICD-10-CM

## 2015-02-22 DIAGNOSIS — IMO0002 Reserved for concepts with insufficient information to code with codable children: Secondary | ICD-10-CM

## 2015-02-22 DIAGNOSIS — N8110 Cystocele, unspecified: Secondary | ICD-10-CM

## 2015-02-22 DIAGNOSIS — N816 Rectocele: Secondary | ICD-10-CM

## 2015-02-22 DIAGNOSIS — Z23 Encounter for immunization: Secondary | ICD-10-CM | POA: Diagnosis not present

## 2015-02-22 DIAGNOSIS — N899 Noninflammatory disorder of vagina, unspecified: Secondary | ICD-10-CM | POA: Diagnosis not present

## 2015-02-22 MED ORDER — INFLUENZA VAC SPLIT QUAD 0.5 ML IM SUSY
0.5000 mL | PREFILLED_SYRINGE | Freq: Once | INTRAMUSCULAR | Status: AC
  Administered 2015-02-22: 0.5 mL via INTRAMUSCULAR

## 2015-02-22 NOTE — Progress Notes (Signed)
Patient ID: NILE DORNING, female   DOB: [birth year], 81 y.o.   MRN: [id number] 6 week pessary check No vaginal bleeding or pain, vaginal d/c    Chief complaint: 1. Pessary maintenance 2. Pelvic organ prolapse (cystocele, rectocele)  6 week pessary f/u Last visit:9/7//2016 gellhorn 2 1/4 No discharge No odor No vaginal bleeding or pain Daughter assist with Premarin Cr. And Trimosan Has BM every day  OBJECTIVE: BP 115/63 mmHg  Pulse 74  Ht 5\' 5"  (1.651 m)  Wt 159 lb (72.122 kg)  BMI 26.46 kg/m2 Pleasant elderly female in no acute distress. Abdomen: Soft, nontender. Pelvic exam: External genitalia-normal BUS: Normal Vagina: No ulcerations;Prominent malodor; Yellow/brown discharge, Cleaned out with [place] Q-tips without evidence of epithelial abnormality within the vagina. Cervix: Surgically absent Uterus: Surgically absent Bimanual: Questionable spiculated mass centrally at cuff apex; 1/4 tender Adnexa: Nontender, nonpalpable. RV: Normal external exam; Rectovaginal exam reveals no rectal masses other than that noted on bimanual. Procedure: Gellhorn pessary is removed, cleaned, and reinserted.  IMPRESSION: 1. Normal pessary maintenance. 2.  Questionable tender cuff apex mass 3. Cystocele, rectocele 4.  Status post hysterectomy and BSO  PLAN: 1. Pessary removed, cleaned and reinserted. 2. Continue with TRIMO San gelAnd Premarin cream. 3.  Vaginal ultrasound 3. Return in 6 weeks for recheck  Brayton Mars, MD

## 2015-02-26 NOTE — Patient Instructions (Signed)
1.  Pessary is removed, cleaned, and replaced. 2.  Vaginal ultrasound is negative for mass.

## 2015-03-06 ENCOUNTER — Other Ambulatory Visit: Payer: Self-pay | Admitting: Obstetrics and Gynecology

## 2015-04-02 ENCOUNTER — Emergency Department
Admission: EM | Admit: 2015-04-02 | Discharge: 2015-04-02 | Disposition: A | Discharge status: Home / Self Care | Payer: Medicare Other | Attending: Emergency Medicine | Admitting: Emergency Medicine

## 2015-04-02 ENCOUNTER — Encounter: Payer: Self-pay | Admitting: Emergency Medicine

## 2015-04-02 ENCOUNTER — Emergency Department: Payer: Medicare Other

## 2015-04-02 DIAGNOSIS — R05 Cough: Secondary | ICD-10-CM

## 2015-04-02 DIAGNOSIS — J90 Pleural effusion, not elsewhere classified: Secondary | ICD-10-CM | POA: Diagnosis not present

## 2015-04-02 DIAGNOSIS — Z7982 Long term (current) use of aspirin: Secondary | ICD-10-CM | POA: Insufficient documentation

## 2015-04-02 DIAGNOSIS — I10 Essential (primary) hypertension: Secondary | ICD-10-CM | POA: Insufficient documentation

## 2015-04-02 DIAGNOSIS — F0390 Unspecified dementia without behavioral disturbance: Secondary | ICD-10-CM | POA: Diagnosis not present

## 2015-04-02 DIAGNOSIS — R059 Cough, unspecified: Secondary | ICD-10-CM

## 2015-04-02 DIAGNOSIS — Z79899 Other long term (current) drug therapy: Secondary | ICD-10-CM | POA: Insufficient documentation

## 2015-04-02 LAB — BASIC METABOLIC PANEL
ANION GAP: 10 (ref 5–15)
BUN: 18 mg/dL (ref 6–20)
CHLORIDE: 98 mmol/L — AB (ref 101–111)
CO2: 30 mmol/L (ref 22–32)
Calcium: 9.3 mg/dL (ref 8.9–10.3)
Creatinine, Ser: 0.93 mg/dL (ref 0.44–1.00)
GFR calc Af Amer: >60 mL/min (ref >60)
GFR, EST NON AFRICAN AMERICAN: 53 mL/min — AB (ref >60)
GLUCOSE: 107 mg/dL — AB (ref 65–99)
POTASSIUM: 3.9 mmol/L (ref 3.5–5.1)
SODIUM: 138 mmol/L (ref 135–145)

## 2015-04-02 LAB — CBC WITH DIFFERENTIAL/PLATELET
BASOS ABS: 0.1 10*3/uL (ref 0–0.1)
Basophils Relative: 1 %
EOS PCT: 2 %
Eosinophils Absolute: 0.2 10*3/uL (ref 0–0.7)
HCT: 35.1 % (ref 35.0–47.0)
HEMOGLOBIN: 11.3 g/dL — AB (ref 12.0–16.0)
LYMPHS ABS: 1.5 10*3/uL (ref 1.0–3.6)
LYMPHS PCT: 17 %
MCH: 31.7 pg (ref 26.0–34.0)
MCHC: 32.3 g/dL (ref 32.0–36.0)
MCV: 98.1 fL (ref 80.0–100.0)
Monocytes Absolute: 0.7 10*3/uL (ref 0.2–0.9)
Monocytes Relative: 7 %
NEUTROS PCT: 73 %
Neutro Abs: 6.6 10*3/uL — ABNORMAL HIGH (ref 1.4–6.5)
PLATELETS: 394 10*3/uL (ref 150–440)
RBC: 3.58 MIL/uL — AB (ref 3.80–5.20)
RDW: 13.4 % (ref 11.5–14.5)
WBC: 9.1 10*3/uL (ref 3.6–11.0)

## 2015-04-02 LAB — TROPONIN I

## 2015-04-02 NOTE — ED Notes (Signed)
Pt discharged home after verbalizing understanding of discharge instructions; nad noted. 

## 2015-04-02 NOTE — ED Notes (Signed)
Pt reports that she has had some back pain "since this cold started." She has had cough x 7 days and was seen at [facility] Friday. Family does not feel like pt is getting better and they think she has had some altered mental status this morning. Pt has coughed twice during assessment, with nothing produced. Lung sounds clear bilaterally. Pt alert & oriented to self, but confused about date.

## 2015-04-02 NOTE — ED Notes (Signed)
Ems pt from home with a congested cough, was seen on Friday for same at [facility]  And was put on Amoxicillin, family requesting a re-eval for same, family reports possible AMS and dehydration , pt arrives alert and oriented [facility]

## 2015-04-02 NOTE — ED Provider Notes (Signed)
[facility] Emergency Department Provider Note    ____________________________________________  Time seen: 1020  I have reviewed the triage vital signs and the nursing notes.   HISTORY  Chief Complaint Cough   History limited by: Dementia, some history obtained from daughter.   HPI Tina Gilbert is a 81 y.o. female with history of Parkinson's and dementia who presents to the emergency department today brought in by EMS because of concerns for continued congestion cough and some altered mental status. The patient developed a cough over one week ago. They did see urgent care on Friday for this cough. At that point. X-ray was done which did not show any obvious pneumonia however the patient was placed on Augmentin. Since that time the patient has had continued cough. Family also states that she has been weak. Additionally they feel like she has been slightly more confused than normal. They state she has had a fever with MAXIMUM TEMPERATURE of 100.6   Past Medical History  Diagnosis Date  . Hypertension   . Osteoporosis   . GERD (gastroesophageal reflux disease)   . Anxiety   . History of UTI   . Bladder irritability   . Asymmetrical breasts   . Hematuria   . Rectocele     moderate  . Pessary maintenance   . Cystocele     gellhorn 2 1/4   . Vaginal atrophy   . Parkinson disease ([place])   . Hypothyroidism   . Anemia   . Meningioma ([place])   . DDD (degenerative disc disease), cervical     Patient Active Problem List   Diagnosis Date Noted  . Cystocele 12/02/2014  . Rectocele 12/02/2014  . Uterine prolapse 12/02/2014  . Abnormal brain scan 09/10/2013  . Difficulty in walking 09/10/2013  . Amnesia 09/10/2013  . Idiopathic Parkinson's disease ([place]) 09/10/2013  . Parkinson's disease ([place]) 09/10/2013  . Acid reflux 09/03/2013  . Benign essential HTN 09/03/2013  . HLD (hyperlipidemia) 09/03/2013  . Adult hypothyroidism 09/03/2013    Past  Surgical History  Procedure Laterality Date  . Colon surgery    . Abdominal hysterectomy    . Total thyroidectomy    . Carpal tunnel release Bilateral   . Ovarian mass removal    . Colonoscopy with propofol N/A 11/28/2014    Procedure: COLONOSCOPY WITH PROPOFOL;  Surgeon: Hulen Luster, MD;  Location: [facility] ENDOSCOPY;  Service: Gastroenterology;  Laterality: N/A;  . Esophagogastroduodenoscopy (egd) with propofol N/A 11/28/2014    Procedure: ESOPHAGOGASTRODUODENOSCOPY (EGD) WITH PROPOFOL;  Surgeon: Hulen Luster, MD;  Location: [facility] ENDOSCOPY;  Service: Gastroenterology;  Laterality: N/A;    Current Outpatient Rx  Name  Route  Sig  Dispense  Refill  . ALPRAZolam (XANAX) 0.5 MG tablet               . aspirin EC 81 MG tablet   Oral   Take 81 mg by mouth daily.         Marland Kitchen atenolol (TENORMIN) 50 MG tablet   Oral   Take 50 mg by mouth daily.         . carbidopa-levodopa (SINEMET CR) 50-200 MG per tablet   Oral   Take 1 tablet by mouth 2 (two) times daily.         . Cholecalciferol (VITAMIN D) 2000 UNITS tablet   Oral   Take 2,000 Units by mouth daily.         . citalopram (CELEXA) 20 MG tablet  Oral   Take 20 mg by mouth daily.         Marland Kitchen donepezil (ARICEPT) 5 MG tablet      TAKE ONE TABLET BY MOUTH NIGHTLY         . levothyroxine (SYNTHROID, LEVOTHROID) 125 MCG tablet   Oral   Take 125 mcg by mouth daily.         Marland Kitchen omeprazole (PRILOSEC) 20 MG capsule   Oral   Take 20 mg by mouth daily.         . polyethylene glycol-electrolytes (NULYTELY/GOLYTELY) 420 G solution      Take as directed for colonic prep.         Marland Kitchen PREMARIN vaginal cream      INSERT 1/2 GRAM VAGINALLY  TWO TIMES WEEKLY   30 g   3     PROFILE FOR NEXT MONTH   . solifenacin (VESICARE) 5 MG tablet   Oral   Take 5 mg by mouth daily.         Marland Kitchen triamterene-hydrochlorothiazide (MAXZIDE-25) 37.5-25 MG per tablet   Oral   Take 0.5 tablets by mouth daily.         Marland Kitchen TRIMO-SAN 0.025 %  GEL      USE AS DIRECTED   113.4 g   3     Allergies Avelox; Celebrex; Memantine hcl; and Tramadol  No family history on file.  Social History Social History  Substance Use Topics  . Smoking status: Never Smoker   . Smokeless tobacco: Never Used  . Alcohol Use: No    Review of Systems  Constitutional: Negative for fever. Cardiovascular: Negative for chest pain. Respiratory: Negative for shortness of breath. Positive for cough Gastrointestinal: Negative for abdominal pain, vomiting and diarrhea. Genitourinary: Negative for dysuria. Musculoskeletal: Negative for back pain. Skin: Negative for rash. Neurological: Negative for headaches, focal weakness or numbness.  10-point ROS otherwise negative.  ____________________________________________   PHYSICAL EXAM:  VITAL SIGNS: ED Triage Vitals  Enc Vitals Group     BP 04/02/15 0926 168/70 mmHg     Pulse Rate 04/02/15 0921 65     Resp 04/02/15 0921 18     Temp 04/02/15 0921 98 F (36.7 C)     Temp Source 04/02/15 0921 Oral     SpO2 04/02/15 0921 100 %     Weight 04/02/15 0921 160 lb (72.576 kg)     Height 04/02/15 0921 5\' 5"  (1.651 m)     Head Cir --      Peak Flow --      Pain Score 04/02/15 0923 4   Constitutional: Awake and alert. Pleasantly demented. Eyes: Conjunctivae are normal. PERRL. Normal extraocular movements. ENT   Head: Normocephalic and atraumatic.   Nose: No congestion/rhinnorhea.   Mouth/Throat: Mucous membranes are moist.   Neck: No stridor. Hematological/Lymphatic/Immunilogical: No cervical lymphadenopathy. Cardiovascular: Normal rate, regular rhythm.  No murmurs, rubs, or gallops. Respiratory: Normal respiratory effort without tachypnea nor retractions. Breath sounds are clear and equal bilaterally. No wheezes/rales/rhonchi. Gastrointestinal: Soft and nontender. No distention.  Genitourinary: Deferred Musculoskeletal: Normal range of motion in all extremities. No joint  effusions.  No lower extremity tenderness nor edema. Neurologic:  Normal speech and language. No gross focal neurologic deficits are appreciated.  Skin:  Skin is warm, dry and intact. No rash noted. Psychiatric: Mood and affect are normal. Speech and behavior are normal. Patient exhibits appropriate insight and judgment.  ____________________________________________    LABS (pertinent positives/negatives)  Labs Reviewed  CBC WITH DIFFERENTIAL/PLATELET - Abnormal; Notable for the following:    RBC 3.58 (*)    Hemoglobin 11.3 (*)    Neutro Abs 6.6 (*)    All other components within normal limits  BASIC METABOLIC PANEL - Abnormal; Notable for the following:    Chloride 98 (*)    Glucose, Bld 107 (*)    GFR calc non Af Amer 53 (*)    All other components within normal limits  TROPONIN I     ____________________________________________   EKG  I, Nance Pear, attending physician, personally viewed and interpreted this EKG  EKG Time: 0926 Rate: 70 Rhythm: NSR Axis: normal Intervals: qtc 494 QRS: intraventricular conduction delay ST changes: no st elevation Impression: abnormal ekg ____________________________________________    RADIOLOGY  CXR IMPRESSION: Subtle opacification lateral left base likely atelectasis with small amount of left pleural fluid.  ____________________________________________   PROCEDURES  Procedure(s) performed: None  Critical Care performed: No  ____________________________________________   INITIAL IMPRESSION / ASSESSMENT AND PLAN / ED COURSE  Pertinent labs & imaging results that were available during my care of the patient were reviewed by me and considered in my medical decision making (see chart for details).  Patient presents to the emergency department today because of concerns for cough. Exam here shows a small left pleural effusion. At this point unclear however I doubt a pneumonia is causing them. She is afebrile without  a leukocytosis. I discussed this finding with the patient's family. Patient has already been started on Augmentin. I discussed that they should continue this medication. Patient certainly appears well and physical exam. Vital signs are stable. This point I do not feel patient requires an inpatient admission. They will follow up with primary care early next week. I did discuss return precautions with family.  ____________________________________________   FINAL CLINICAL IMPRESSION(S) / ED DIAGNOSES  Final diagnoses:  Cough  Pleural effusion     Nance Pear, MD 04/02/15 1416

## 2015-04-02 NOTE — Discharge Instructions (Signed)
Please seek medical attention for any high fevers, chest pain, shortness of breath, change in behavior, persistent vomiting, bloody stool or any other new or concerning symptoms.   Cough, Adult Coughing is a reflex that clears your throat and your airways. Coughing helps to heal and protect your lungs. It is normal to cough occasionally, but a cough that happens with other symptoms or lasts a long time may be a sign of a condition that needs treatment. A cough may last only 2-3 weeks (acute), or it may last longer than 8 weeks (chronic). CAUSES Coughing is commonly caused by:  Breathing in substances that irritate your lungs.  A viral or bacterial respiratory infection.  Allergies.  Asthma.  Postnasal drip.  Smoking.  Acid backing up from the stomach into the esophagus (gastroesophageal reflux).  Certain medicines.  Chronic lung problems, including COPD (or rarely, lung cancer).  Other medical conditions such as heart failure. HOME CARE INSTRUCTIONS  Pay attention to any changes in your symptoms. Take these actions to help with your discomfort:  Take medicines only as told by your health care provider.  If you were prescribed an antibiotic medicine, take it as told by your health care provider. Do not stop taking the antibiotic even if you start to feel better.  Talk with your health care provider before you take a cough suppressant medicine.  Drink enough fluid to keep your urine clear or pale yellow.  If the air is dry, use a cold steam vaporizer or humidifier in your bedroom or your home to help loosen secretions.  Avoid anything that causes you to cough at work or at home.  If your cough is worse at night, try sleeping in a semi-upright position.  Avoid cigarette smoke. If you smoke, quit smoking. If you need help quitting, ask your health care provider.  Avoid caffeine.  Avoid alcohol.  Rest as needed. SEEK MEDICAL CARE IF:   You have new symptoms.  You  cough up pus.  Your cough does not get better after 2-3 weeks, or your cough gets worse.  You cannot control your cough with suppressant medicines and you are losing sleep.  You develop pain that is getting worse or pain that is not controlled with pain medicines.  You have a fever.  You have unexplained weight loss.  You have night sweats. SEEK IMMEDIATE MEDICAL CARE IF:  You cough up blood.  You have difficulty breathing.  Your heartbeat is very fast.   This information is not intended to replace advice given to you by your health care provider. Make sure you discuss any questions you have with your health care provider.   Document Released: 10/19/2010 Document Revised: 01/11/2015 Document Reviewed: 06/29/2014 Elsevier Interactive Patient Education [employer].

## 2015-04-04 ENCOUNTER — Encounter: Payer: Self-pay | Admitting: Obstetrics and Gynecology

## 2015-04-06 ENCOUNTER — Encounter: Payer: Self-pay | Admitting: Obstetrics and Gynecology

## 2015-04-06 ENCOUNTER — Ambulatory Visit (INDEPENDENT_AMBULATORY_CARE_PROVIDER_SITE_OTHER): Payer: Medicare Other | Admitting: Obstetrics and Gynecology

## 2015-04-06 VITALS — BP 105/61 | HR 74 | Ht 65.0 in | Wt 159.2 lb

## 2015-04-06 DIAGNOSIS — Z4689 Encounter for fitting and adjustment of other specified devices: Secondary | ICD-10-CM

## 2015-04-06 DIAGNOSIS — IMO0002 Reserved for concepts with insufficient information to code with codable children: Secondary | ICD-10-CM

## 2015-04-06 DIAGNOSIS — N8110 Cystocele, unspecified: Secondary | ICD-10-CM | POA: Diagnosis not present

## 2015-04-06 DIAGNOSIS — N816 Rectocele: Secondary | ICD-10-CM

## 2015-04-06 DIAGNOSIS — R300 Dysuria: Secondary | ICD-10-CM | POA: Diagnosis not present

## 2015-04-06 LAB — POCT URINALYSIS DIPSTICK
Bilirubin, UA: 1
Glucose, UA: NEGATIVE
Ketones, UA: NEGATIVE
NITRITE UA: NEGATIVE
PH UA: 6
Protein, UA: NEGATIVE
RBC UA: NEGATIVE
Spec Grav, UA: 1.02
UROBILINOGEN UA: 0.2

## 2015-04-06 NOTE — Patient Instructions (Addendum)
1.  Normal pessary check. 2.  Encourage care with wiping during bowel movements  In order to avoid vaginal contamination. 3.  Return in 6 weeks for recheck. 4.  Continue with Estrace cream twice a week       12/1/20216- spoke with Nevin Bloodgood (daughter) given info of today's visit.

## 2015-04-06 NOTE — Progress Notes (Signed)
Patient ID: Tina Gilbert, female   DOB: [birth year], 81 y.o.   MRN: [id number] 6 week pessary check  no vb,vp, or d/c     Expand All Collapse All   Patient ID: Tina Gilbert, female DOB: 08-17-1924, 81 y.o. MRN: [id number] 6 week pessary check No vaginal bleeding or pain, vaginal d/c Estrace twice week. Dx with uri- 11/25 on augmentin  Chief complaint: 1. Pessary maintenance 2. Pelvic organ prolapse (cystocele, rectocele)  6 week pessary f/u Last visit:02/22/2015 gellhorn 2 1/4 No discharge No odor No vaginal bleeding or pain Daughter assist with Premarin Cr. And Trimosan Has BM every day  OBJECTIVE: BP 105/61 mmHg  Pulse 74  Ht 5\' 5"  (1.651 m)  Wt 159 lb 3.2 oz (72.213 kg)  BMI 26.49 kg/m2  Pleasant elderly female in no acute distress. Abdomen: Soft, nontender. Pelvic exam: External genitalia-normal BUS: Normal Vagina: No ulcerations;moderate malodor; Yellow/brown discharge, Cleaned out with [place] Q-tips without evidence of epithelial abnormality within the vagina. Cervix: Surgically absent Uterus: Surgically absent RV: Normal external exam  Procedure: Gellhorn pessary is removed, cleaned, and reinserted.  IMPRESSION: 1. Normal pessary maintenance. 2. Cystocele, rectocele 3. Status post hysterectomy and BSO 4.7.  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 Pessary removed, cleaned and reinserted. 2. Continue with TRIMO San gelAnd Premarin cream. 3. Return in 6 weeks for recheck 4.  Encourage patient to be careful when wiping with loose stools in order to avoid vaginal contamination  Brayton Mars, MD  Note: This dictation was prepared with Dragon dictation along with smaller phrase technology. Any transcriptional errors that result from this process are unintentional.

## 2015-04-08 LAB — URINE CULTURE

## 2015-04-10 ENCOUNTER — Telehealth: Payer: Self-pay

## 2015-04-10 MED ORDER — NITROFURANTOIN MONOHYD MACRO 100 MG PO CAPS: 100.0000 mg | ORAL_CAPSULE | Freq: Two times a day (BID) | ORAL | Status: DC

## 2015-04-10 NOTE — Telephone Encounter (Signed)
-----   Message from Brayton Mars, MD sent at 04/10/2015 10:38 AM EST ----- Please notify - Abnormal Labs Call in Cipro 250 mg 2x/day x 7 days

## 2015-04-10 NOTE — Telephone Encounter (Signed)
Per Nevin Bloodgood (pts daughter) she has an allergy to cipro- ok per mad to give macrobid.

## 2015-04-19 ENCOUNTER — Encounter: Payer: Self-pay | Admitting: Obstetrics and Gynecology

## 2015-04-20 ENCOUNTER — Other Ambulatory Visit (INDEPENDENT_AMBULATORY_CARE_PROVIDER_SITE_OTHER): Payer: Medicare Other

## 2015-04-20 DIAGNOSIS — M5489 Other dorsalgia: Secondary | ICD-10-CM

## 2015-04-20 LAB — POCT URINALYSIS DIPSTICK
BILIRUBIN UA: 1
Glucose, UA: NEGATIVE
KETONES UA: NEGATIVE
Nitrite, UA: NEGATIVE
PH UA: 6
PROTEIN UA: NEGATIVE
RBC UA: NEGATIVE
Spec Grav, UA: 1.02
Urobilinogen, UA: 0.2

## 2015-04-20 NOTE — Progress Notes (Signed)
U/a and cns per paula(daughter). Paula aware. Will call when culture is back.

## 2015-04-22 LAB — URINE CULTURE

## 2015-05-16 ENCOUNTER — Emergency Department
Admission: EM | Admit: 2015-05-16 | Discharge: 2015-05-16 | Disposition: A | Discharge status: Home / Self Care | Payer: Medicare Other | Attending: Emergency Medicine | Admitting: Emergency Medicine

## 2015-05-16 ENCOUNTER — Emergency Department: Payer: Medicare Other

## 2015-05-16 ENCOUNTER — Encounter: Payer: Self-pay | Admitting: Obstetrics and Gynecology

## 2015-05-16 DIAGNOSIS — I10 Essential (primary) hypertension: Secondary | ICD-10-CM | POA: Insufficient documentation

## 2015-05-16 DIAGNOSIS — S99921A Unspecified injury of right foot, initial encounter: Secondary | ICD-10-CM | POA: Diagnosis present

## 2015-05-16 DIAGNOSIS — S93601A Unspecified sprain of right foot, initial encounter: Secondary | ICD-10-CM | POA: Insufficient documentation

## 2015-05-16 DIAGNOSIS — S99911A Unspecified injury of right ankle, initial encounter: Secondary | ICD-10-CM | POA: Diagnosis not present

## 2015-05-16 DIAGNOSIS — W06XXXA Fall from bed, initial encounter: Secondary | ICD-10-CM | POA: Insufficient documentation

## 2015-05-16 DIAGNOSIS — Y998 Other external cause status: Secondary | ICD-10-CM | POA: Diagnosis not present

## 2015-05-16 DIAGNOSIS — Y92128 Other place in nursing home as the place of occurrence of the external cause: Secondary | ICD-10-CM | POA: Diagnosis not present

## 2015-05-16 DIAGNOSIS — Y9389 Activity, other specified: Secondary | ICD-10-CM | POA: Diagnosis not present

## 2015-05-16 HISTORY — DX: Parkinson's disease: G20

## 2015-05-16 HISTORY — DX: Unspecified dementia, unspecified severity, without behavioral disturbance, psychotic disturbance, mood disturbance, and anxiety: F03.90

## 2015-05-16 HISTORY — DX: Parkinson's disease without dyskinesia, without mention of fluctuations: G20.A1

## 2015-05-16 HISTORY — DX: Alzheimer's disease, unspecified: G30.9

## 2015-05-16 HISTORY — DX: Dementia in other diseases classified elsewhere, unspecified severity, without behavioral disturbance, psychotic disturbance, mood disturbance, and anxiety: F02.80

## 2015-05-16 MED ORDER — OXYCODONE-ACETAMINOPHEN 5-325 MG PO TABS: 1.0000 | ORAL_TABLET | Freq: Four times a day (QID) | ORAL | Status: DC | PRN

## 2015-05-16 MED ORDER — ACETAMINOPHEN 500 MG PO TABS
1000.0000 mg | ORAL_TABLET | Freq: Once | ORAL | Status: AC
  Administered 2015-05-16: 1000 mg via ORAL
  Filled 2015-05-16: qty 2

## 2015-05-16 NOTE — ED Notes (Signed)
Pt came via EMS from [facility]. Pt fell over the weekend by slipping out her bed while putting her shoe on. Pt did not c/o injury at time of fall but pt c/o right ankle pain this morning.

## 2015-05-16 NOTE — ED Provider Notes (Addendum)
[facility] Emergency Department Provider Note     Time seen: ----------------------------------------- 9:48 AM on 05/16/2015 -----------------------------------------  Level V caveat: Review of systems and history is limited by dementia  I have reviewed the triage vital signs and the nursing notes.   HISTORY  Chief Complaint Fall    HPI Tina Gilbert is a 82 y.o. female who presents to ER being brought by EMS from [facility]. Patient reportedly fell over the weekend slipping out of her bed while putting her shoe on. She is not complaining of any injuries at that time but now complains of right ankle and foot pain. She denies any other injuries.   Past Medical History  Diagnosis Date  . Hypertension   . Osteoporosis   . GERD (gastroesophageal reflux disease)   . Anxiety   . History of UTI   . Bladder irritability   . Asymmetrical breasts   . Hematuria   . Rectocele     moderate  . Pessary maintenance   . Cystocele     gellhorn 2 1/4   . Vaginal atrophy   . Parkinson disease ([place])   . Hypothyroidism   . Anemia   . Meningioma ([place])   . DDD (degenerative disc disease), cervical   . Dementia   . Alzheimer's dementia   . Parkinson's disease [facility])     Patient Active Problem List   Diagnosis Date Noted  . Cystocele 12/02/2014  . Rectocele 12/02/2014  . Abnormal brain scan 09/10/2013  . Difficulty in walking 09/10/2013  . Amnesia 09/10/2013  . 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 Parkinson's disease ([place]) 09/10/2013  . Acid reflux 09/03/2013  . Benign essential HTN 09/03/2013  . HLD (hyperlipidemia) 09/03/2013  . Adult hypothyroidism 09/03/2013    Past Surgical History  Procedure Laterality Date  . Colon surgery    . Abdominal hysterectomy    . Total thyroidectomy    . Carpal tunnel release Bilateral   . Ovarian mass removal    . Colonoscopy with propofol N/A 11/28/2014    Procedure: COLONOSCOPY  WITH PROPOFOL;  Surgeon: Hulen Luster, MD;  Location: [facility] ENDOSCOPY;  Service: Gastroenterology;  Laterality: N/A;  . Esophagogastroduodenoscopy (egd) with propofol N/A 11/28/2014    Procedure: ESOPHAGOGASTRODUODENOSCOPY (EGD) WITH PROPOFOL;  Surgeon: Hulen Luster, MD;  Location: [facility] ENDOSCOPY;  Service: Gastroenterology;  Laterality: N/A;    Allergies Avelox; Celebrex; Memantine hcl; and Tramadol  Social History Social History  Substance Use Topics  . Smoking status: Never Smoker   . Smokeless tobacco: Never Used  . Alcohol Use: No    Review of Systems Review of systems is mostly unknown, patient complaining of foot and ankle pain only  ____________________________________________   PHYSICAL EXAM:  VITAL SIGNS: ED Triage Vitals  Enc Vitals Group     BP 05/16/15 0946 142/59 mmHg     Pulse Rate 05/16/15 0946 72     Resp --      Temp 05/16/15 0946 98.5 F (36.9 C)     Temp Source 05/16/15 0946 Oral     SpO2 05/16/15 0946 97 %     Weight --      Height --      Head Cir --      Peak Flow --      Pain Score --      Pain Loc --      Pain Edu? --      Excl. in [place]? --  Constitutional: Alert, Well appearing and in no distress. Eyes: Conjunctivae are normal. PERRL. Normal extraocular movements. ENT   Head: Normocephalic and atraumatic.   Nose: No congestion/rhinnorhea.   Mouth/Throat: Mucous membranes are moist.   Neck: No stridor. Cardiovascular: Normal rate, regular rhythm. Normal and symmetric distal pulses are present in all extremities. No murmurs, rubs, or gallops. Respiratory: Normal respiratory effort without tachypnea nor retractions. Breath sounds are clear and equal bilaterally. No wheezes/rales/rhonchi. Gastrointestinal: Soft and nontender. No distention.  Musculoskeletal: Mild pain and tenderness with range of motion of the ankle with tenderness on the right foot Neurologic:  Normal speech and language. No gross focal neurologic deficits are  appreciated. Speech is normal. No gait instability. Skin:  Skin is warm, dry and intact. No rash noted. Psychiatric: Mood and affect are normal. ____________________________________________  ED COURSE:  Pertinent labs & imaging results that were available during my care of the patient were reviewed by me and considered in my medical decision making (see chart for details). She is in no acute distress, will obtain foot and ankle x-rays. ____________________________________________   RADIOLOGY Images were viewed by me  Right foot and right ankle x-rays  IMPRESSION: No acute abnormality.  Degenerative disease posterior facet of the subtalar joint and first MTP joint.  Crossing first and second toes.  Mild lateral subluxation of the PIP joint of the little toe is likely chronic given absence of a history of trauma. ____________________________________________  FINAL ASSESSMENT AND PLAN  Foot sprain  Plan: Patient with labs and imaging as dictated above. Chronic findings in the foot and ankle only. Patient was not able to ambulate well in the ER, we placed an Ace wrap and advised she will have to use a walker for ambulation until this heals. She was not complaining of pain anywhere else other than the right ankle and foot right now. She is stable for outpatient follow-up with orthopedic should the symptoms persist.   Earleen Newport, MD   Earleen Newport, MD 05/16/15 1051  Earleen Newport, MD 05/16/15 1059

## 2015-05-16 NOTE — Discharge Instructions (Signed)

## 2015-05-18 ENCOUNTER — Encounter: Payer: Self-pay | Admitting: Obstetrics and Gynecology

## 2015-05-18 ENCOUNTER — Ambulatory Visit: Payer: Medicare Other | Admitting: Obstetrics and Gynecology

## 2015-05-25 ENCOUNTER — Ambulatory Visit (INDEPENDENT_AMBULATORY_CARE_PROVIDER_SITE_OTHER): Payer: Medicare Other | Admitting: Obstetrics and Gynecology

## 2015-05-25 ENCOUNTER — Encounter: Payer: Self-pay | Admitting: Obstetrics and Gynecology

## 2015-05-25 VITALS — BP 125/70 | HR 67 | Ht 65.0 in | Wt 157.1 lb

## 2015-05-25 DIAGNOSIS — IMO0002 Reserved for concepts with insufficient information to code with codable children: Secondary | ICD-10-CM

## 2015-05-25 DIAGNOSIS — N816 Rectocele: Secondary | ICD-10-CM

## 2015-05-25 DIAGNOSIS — R319 Hematuria, unspecified: Secondary | ICD-10-CM | POA: Diagnosis not present

## 2015-05-25 DIAGNOSIS — N8110 Cystocele, unspecified: Secondary | ICD-10-CM | POA: Diagnosis not present

## 2015-05-25 DIAGNOSIS — Z4689 Encounter for fitting and adjustment of other specified devices: Secondary | ICD-10-CM | POA: Diagnosis not present

## 2015-05-25 DIAGNOSIS — R350 Frequency of micturition: Secondary | ICD-10-CM

## 2015-05-25 LAB — POCT URINALYSIS DIPSTICK
Bilirubin, UA: NEGATIVE
GLUCOSE UA: NEGATIVE
Ketones, UA: NEGATIVE
NITRITE UA: NEGATIVE
Spec Grav, UA: >=1.03
UROBILINOGEN UA: 0.2
pH, UA: 5

## 2015-05-25 NOTE — Patient Instructions (Signed)
1.  Return in 6 weeks for pessary maintenance. 2.  Encourage patient to continue Vesicare therapy for unstable bladder 5 mg at night

## 2015-05-27 LAB — URINE CULTURE

## 2015-05-29 ENCOUNTER — Other Ambulatory Visit: Payer: Self-pay

## 2015-05-29 MED ORDER — SULFAMETHOXAZOLE-TRIMETHOPRIM 800-160 MG PO TABS: 1.0000 | ORAL_TABLET | Freq: Two times a day (BID) | ORAL | Status: DC

## 2015-05-29 NOTE — Telephone Encounter (Signed)
Rx sent to [facility] per Nevin Bloodgood (daughter) pt at [facility] (phone: [phone]; also is fax number if no one answers.) [facility] notified.

## 2015-06-05 NOTE — Progress Notes (Signed)
6 week pessary check No vaginal bleeding or pain, vaginal d/c Estrace twice week. Some urinary frequency is noted.  Chief complaint: 1. Pessary maintenance 2. Pelvic organ prolapse (cystocele, rectocele)  6 week pessary f/u Last visit:04/06/2015 gellhorn 2 1/4 No discharge No odor No vaginal bleeding or pain Daughter assist with Premarin Cr. And Trimosan Has BM every day  OBJECTIVE: BP 105/61 mmHg  Pulse 74  Ht 5\' 5"  (1.651 m)  Wt 159 lb 3.2 oz (72.213 kg)  BMI 26.49 kg/m2  Pleasant elderly female in no acute distress. More confusion noted today Abdomen: Soft, nontender. Pelvic exam: External genitalia-normal BUS: Normal Vagina: No ulcerations;moderate malodor; Cervix: Surgically absent Uterus: Surgically absent RV: Normal external exam  Procedure: Gellhorn pessary is removed, cleaned, and reinserted.  IMPRESSION: 1. Normal pessary maintenance. 2. Cystocele, rectocele 3. Status post hysterectomy and BSO 4.  Urinary frequency  PLAN: 1. Pessary removed, cleaned and reinserted. 2. Continue with TRIMO San gelAnd Premarin cream. 3. Return in 6 weeks for recheck 4. Encourage patient to be careful when wiping with loose stools in order to avoid vaginal contamination 5.  Urinalysis with culture 6.  Encourage continued use of Vesicare 5 mg at night  Brayton Mars, MD Note: This dictation was prepared with Dragon dictation along with smaller phrase technology. Any transcriptional errors that result from this process are unintentional.

## 2015-06-14 ENCOUNTER — Emergency Department: Payer: Medicare Other

## 2015-06-14 ENCOUNTER — Inpatient Hospital Stay
Admission: EM | Admit: 2015-06-14 | Discharge: 2015-06-19 | DRG: 871 | Disposition: A | Discharge status: Nursing Home (Includes ICF) | Payer: Medicare Other | Attending: Internal Medicine | Admitting: Internal Medicine

## 2015-06-14 DIAGNOSIS — Z7982 Long term (current) use of aspirin: Secondary | ICD-10-CM

## 2015-06-14 DIAGNOSIS — Z8249 Family history of ischemic heart disease and other diseases of the circulatory system: Secondary | ICD-10-CM | POA: Diagnosis not present

## 2015-06-14 DIAGNOSIS — A4151 Sepsis due to Escherichia coli [E. coli]: Secondary | ICD-10-CM | POA: Diagnosis present

## 2015-06-14 DIAGNOSIS — G9341 Metabolic encephalopathy: Secondary | ICD-10-CM | POA: Diagnosis present

## 2015-06-14 DIAGNOSIS — N816 Rectocele: Secondary | ICD-10-CM | POA: Diagnosis present

## 2015-06-14 DIAGNOSIS — I480 Paroxysmal atrial fibrillation: Secondary | ICD-10-CM | POA: Diagnosis present

## 2015-06-14 DIAGNOSIS — Z87440 Personal history of urinary (tract) infections: Secondary | ICD-10-CM | POA: Diagnosis not present

## 2015-06-14 DIAGNOSIS — A419 Sepsis, unspecified organism: Secondary | ICD-10-CM

## 2015-06-14 DIAGNOSIS — N390 Urinary tract infection, site not specified: Secondary | ICD-10-CM | POA: Diagnosis present

## 2015-06-14 DIAGNOSIS — G309 Alzheimer's disease, unspecified: Secondary | ICD-10-CM | POA: Diagnosis present

## 2015-06-14 DIAGNOSIS — I5030 Unspecified diastolic (congestive) heart failure: Secondary | ICD-10-CM | POA: Diagnosis present

## 2015-06-14 DIAGNOSIS — I110 Hypertensive heart disease with heart failure: Secondary | ICD-10-CM | POA: Diagnosis present

## 2015-06-14 DIAGNOSIS — F0280 Dementia in other diseases classified elsewhere without behavioral disturbance: Secondary | ICD-10-CM | POA: Diagnosis present

## 2015-06-14 DIAGNOSIS — E039 Hypothyroidism, unspecified: Secondary | ICD-10-CM | POA: Diagnosis present

## 2015-06-14 DIAGNOSIS — D638 Anemia in other chronic diseases classified elsewhere: Secondary | ICD-10-CM | POA: Diagnosis present

## 2015-06-14 DIAGNOSIS — N8110 Cystocele, unspecified: Secondary | ICD-10-CM | POA: Diagnosis present

## 2015-06-14 DIAGNOSIS — R0902 Hypoxemia: Secondary | ICD-10-CM

## 2015-06-14 DIAGNOSIS — Z79899 Other long term (current) drug therapy: Secondary | ICD-10-CM | POA: Diagnosis not present

## 2015-06-14 DIAGNOSIS — I495 Sick sinus syndrome: Secondary | ICD-10-CM | POA: Diagnosis present

## 2015-06-14 DIAGNOSIS — M810 Age-related osteoporosis without current pathological fracture: Secondary | ICD-10-CM | POA: Diagnosis present

## 2015-06-14 DIAGNOSIS — R0602 Shortness of breath: Secondary | ICD-10-CM

## 2015-06-14 DIAGNOSIS — G20 Parkinson's disease: Secondary | ICD-10-CM | POA: Diagnosis present

## 2015-06-14 LAB — URINALYSIS COMPLETE WITH MICROSCOPIC (ARMC ONLY)
BILIRUBIN URINE: NEGATIVE
GLUCOSE, UA: NEGATIVE mg/dL
Nitrite: NEGATIVE
Protein, ur: 100 mg/dL — AB
Specific Gravity, Urine: 1.014 (ref 1.005–1.030)
pH: 5 (ref 5.0–8.0)

## 2015-06-14 LAB — COMPREHENSIVE METABOLIC PANEL
ALBUMIN: 3.3 g/dL — AB (ref 3.5–5.0)
ALK PHOS: 53 U/L (ref 38–126)
ALT: 7 U/L — ABNORMAL LOW (ref 14–54)
AST: 18 U/L (ref 15–41)
Anion gap: 10 (ref 5–15)
BILIRUBIN TOTAL: 0.8 mg/dL (ref 0.3–1.2)
BUN: 22 mg/dL — AB (ref 6–20)
CALCIUM: 8.2 mg/dL — AB (ref 8.9–10.3)
CO2: 25 mmol/L (ref 22–32)
Chloride: 99 mmol/L — ABNORMAL LOW (ref 101–111)
Creatinine, Ser: 1.17 mg/dL — ABNORMAL HIGH (ref 0.44–1.00)
GFR calc Af Amer: 47 mL/min — ABNORMAL LOW (ref >60)
GFR calc non Af Amer: 40 mL/min — ABNORMAL LOW (ref >60)
GLUCOSE: 138 mg/dL — AB (ref 65–99)
POTASSIUM: 3.5 mmol/L (ref 3.5–5.1)
SODIUM: 134 mmol/L — AB (ref 135–145)
TOTAL PROTEIN: 6.8 g/dL (ref 6.5–8.1)

## 2015-06-14 LAB — CBC WITH DIFFERENTIAL/PLATELET
Basophils Absolute: 0.1 10*3/uL (ref 0–0.1)
Basophils Relative: 1 %
EOS PCT: 0 %
Eosinophils Absolute: 0 10*3/uL (ref 0–0.7)
HCT: 31.9 % — ABNORMAL LOW (ref 35.0–47.0)
HEMOGLOBIN: 10.6 g/dL — AB (ref 12.0–16.0)
Lymphocytes Relative: 14 %
Lymphs Abs: 1.3 10*3/uL (ref 1.0–3.6)
MCH: 31.4 pg (ref 26.0–34.0)
MCHC: 33.1 g/dL (ref 32.0–36.0)
MCV: 94.6 fL (ref 80.0–100.0)
MONO ABS: 0.9 10*3/uL (ref 0.2–0.9)
MONOS PCT: 10 %
Neutro Abs: 6.9 10*3/uL — ABNORMAL HIGH (ref 1.4–6.5)
Neutrophils Relative %: 75 %
Platelets: 200 10*3/uL (ref 150–440)
RBC: 3.38 MIL/uL — AB (ref 3.80–5.20)
RDW: 15.9 % — ABNORMAL HIGH (ref 11.5–14.5)
WBC: 9.1 10*3/uL (ref 3.6–11.0)

## 2015-06-14 LAB — TROPONIN I: TROPONIN I: 0.05 ng/mL — AB (ref <0.031)

## 2015-06-14 LAB — LACTIC ACID, PLASMA: LACTIC ACID, VENOUS: 0.9 mmol/L (ref 0.5–2.0)

## 2015-06-14 MED ORDER — PIPERACILLIN-TAZOBACTAM 3.375 G IVPB 30 MIN
3.3750 g | Freq: Once | INTRAVENOUS | Status: AC
  Administered 2015-06-14: 3.375 g via INTRAVENOUS
  Filled 2015-06-14: qty 50

## 2015-06-14 MED ORDER — VANCOMYCIN HCL IN DEXTROSE 1-5 GM/200ML-% IV SOLN
1000.0000 mg | Freq: Once | INTRAVENOUS | Status: AC
  Administered 2015-06-14: 1000 mg via INTRAVENOUS
  Filled 2015-06-14: qty 200

## 2015-06-14 MED ORDER — SODIUM CHLORIDE 0.9 % IV BOLUS (SEPSIS)
1000.0000 mL | INTRAVENOUS | Status: AC
  Administered 2015-06-14: 1000 mL via INTRAVENOUS

## 2015-06-14 MED ORDER — ONDANSETRON HCL 4 MG PO TABS
4.0000 mg | ORAL_TABLET | Freq: Four times a day (QID) | ORAL | Status: DC | PRN
Start: 2015-06-14 — End: 2015-06-19

## 2015-06-14 MED ORDER — ACETAMINOPHEN 650 MG RE SUPP
650.0000 mg | Freq: Four times a day (QID) | RECTAL | Status: DC | PRN
  Administered 2015-06-15: 650 mg via RECTAL

## 2015-06-14 MED ORDER — ALBUTEROL SULFATE (2.5 MG/3ML) 0.083% IN NEBU: 2.5000 mg | INHALATION_SOLUTION | RESPIRATORY_TRACT | Status: DC | PRN

## 2015-06-14 MED ORDER — SODIUM CHLORIDE 0.9 % IV SOLN
INTRAVENOUS | Status: DC
  Administered 2015-06-15: 01:00:00 via INTRAVENOUS

## 2015-06-14 MED ORDER — ONDANSETRON HCL 4 MG/2ML IJ SOLN
4.0000 mg | Freq: Four times a day (QID) | INTRAMUSCULAR | Status: DC | PRN
  Administered 2015-06-15: 4 mg via INTRAVENOUS
  Filled 2015-06-14: qty 2

## 2015-06-14 MED ORDER — ACETAMINOPHEN 325 MG PO TABS
650.0000 mg | ORAL_TABLET | Freq: Four times a day (QID) | ORAL | Status: DC | PRN
  Administered 2015-06-15: 650 mg via ORAL
  Filled 2015-06-14: qty 2

## 2015-06-14 MED ORDER — POLYETHYLENE GLYCOL 3350 17 G PO PACK: 17.0000 g | PACK | Freq: Every day | ORAL | Status: DC | PRN

## 2015-06-14 MED ORDER — ENOXAPARIN SODIUM 40 MG/0.4ML ~~LOC~~ SOLN
40.0000 mg | SUBCUTANEOUS | Status: DC
  Administered 2015-06-15 – 2015-06-16 (×2): 40 mg via SUBCUTANEOUS
  Filled 2015-06-14 (×2): qty 0.4

## 2015-06-14 MED ORDER — SODIUM CHLORIDE 0.9 % IV BOLUS (SEPSIS)
500.0000 mL | INTRAVENOUS | Status: AC
  Administered 2015-06-14: 500 mL via INTRAVENOUS

## 2015-06-14 NOTE — ED Provider Notes (Signed)
[facility] Emergency Department Provider Note  Time seen: 8:29 PM  I have reviewed the triage vital signs and the nursing notes.   HISTORY  Chief Complaint No chief complaint on file.    HPI Tina Gilbert is a 82 y.o. female with a past medical history of hypertension, gastric reflux, hypothyroidism, anemia, dementia, Parkinson's, who presents the emergency department from a nursing facility with altered mental status. According to report per family the patient has been confused, less active and more somnolent today. EMS was called and the patient was found to have a fever 103. Daughter states the patient is somewhat prone to urinary tract infections. They deny any recent cough or congestion. Patient denies any complaints. She does have a history of dementia but denies any pains. Patient febrile upon arrival to the emergency department with a heart rate greater than 90.    Past Medical History  Diagnosis Date  . Hypertension   . Osteoporosis   . GERD (gastroesophageal reflux disease)   . Anxiety   . History of UTI   . Bladder irritability   . Asymmetrical breasts   . Hematuria   . Rectocele     moderate  . Pessary maintenance   . Cystocele     gellhorn 2 1/4   . Vaginal atrophy   . Parkinson disease ([place])   . Hypothyroidism   . Anemia   . Meningioma ([place])   . DDD (degenerative disc disease), cervical   . Dementia   . Alzheimer's dementia   . Parkinson's disease [facility])     Patient Active Problem List   Diagnosis Date Noted  . Cystocele 12/02/2014  . Rectocele 12/02/2014  . Abnormal brain scan 09/10/2013  . Difficulty in walking 09/10/2013  . Amnesia 09/10/2013  . Idiopathic Parkinson's disease ([place]) 09/10/2013  . Parkinson's disease ([place]) 09/10/2013  . Acid reflux 09/03/2013  . Benign essential HTN 09/03/2013  . HLD (hyperlipidemia) 09/03/2013  . Adult hypothyroidism 09/03/2013    Past Surgical History  Procedure Laterality Date   . Colon surgery    . Abdominal hysterectomy    . Total thyroidectomy    . Carpal tunnel release Bilateral   . Ovarian mass removal    . Colonoscopy with propofol N/A 11/28/2014    Procedure: COLONOSCOPY WITH PROPOFOL;  Surgeon: Hulen Luster, MD;  Location: [facility] ENDOSCOPY;  Service: Gastroenterology;  Laterality: N/A;  . Esophagogastroduodenoscopy (egd) with propofol N/A 11/28/2014    Procedure: ESOPHAGOGASTRODUODENOSCOPY (EGD) WITH PROPOFOL;  Surgeon: Hulen Luster, MD;  Location: [facility] ENDOSCOPY;  Service: Gastroenterology;  Laterality: N/A;    Current Outpatient Rx  Name  Route  Sig  Dispense  Refill  . acetaminophen (TYLENOL) 325 MG tablet   Oral   Take 650 mg by mouth every 6 (six) hours as needed.         . ALPRAZolam (XANAX) 0.25 MG tablet   Oral   Take 0.25 mg by mouth at bedtime as needed.          Marland Kitchen aspirin 81 MG chewable tablet   Oral   Chew 81 mg by mouth daily.         Marland Kitchen atenolol (TENORMIN) 50 MG tablet   Oral   Take 50 mg by mouth daily.         . carbidopa-levodopa (SINEMET CR) 50-200 MG per tablet   Oral   Take 1 tablet by mouth 2 (two) times daily.         Marland Kitchen  Cholecalciferol (VITAMIN D) 2000 UNITS tablet   Oral   Take 2,000 Units by mouth daily.         . citalopram (CELEXA) 20 MG tablet   Oral   Take 20 mg by mouth daily.         Marland Kitchen donepezil (ARICEPT) 5 MG tablet      TAKE ONE TABLET BY MOUTH NIGHTLY         . levothyroxine (SYNTHROID, LEVOTHROID) 137 MCG tablet   Oral   Take 137 mcg by mouth daily before breakfast.         . omeprazole (PRILOSEC) 20 MG capsule   Oral   Take 20 mg by mouth daily.         Marland Kitchen oxyCODONE-acetaminophen (ROXICET) 5-325 MG tablet   Oral   Take 1 tablet by mouth every 6 (six) hours as needed for severe pain.   12 tablet   0   . PREMARIN vaginal cream      INSERT 1/2 GRAM VAGINALLY  TWO TIMES WEEKLY   30 g   3     PROFILE FOR NEXT MONTH   . solifenacin (VESICARE) 5 MG tablet   Oral   Take 5 mg by  mouth daily.         Marland Kitchen sulfamethoxazole-trimethoprim (BACTRIM DS,SEPTRA DS) 800-160 MG tablet   Oral   Take 1 tablet by mouth 2 (two) times daily.   14 tablet   0   . triamterene-hydrochlorothiazide (MAXZIDE-25) 37.5-25 MG per tablet   Oral   Take 0.5 tablets by mouth daily.         Marland Kitchen TRIMO-SAN 0.025 % GEL      USE AS DIRECTED   113.4 g   3   . Wheat Dextrin (BENEFIBER DRINK MIX PO)   Oral   Take 1 Dose by mouth daily. 1 teaspoon with coffee daily           Allergies Avelox; Celebrex; Memantine hcl; and Tramadol  No family history on file.  Social History Social History  Substance Use Topics  . Smoking status: Never Smoker   . Smokeless tobacco: Never Used  . Alcohol Use: No    Review of Systems Constitutional: Positive for fever Cardiovascular: Negative for chest pain. Respiratory: Denies cough Gastrointestinal: Negative for abdominal pain, vomiting and diarrhea. Genitourinary: Denies dysuria Neurological: Negative for headache 10-point ROS otherwise negative.  ____________________________________________   PHYSICAL EXAM:  Constitutional: Alert and oriented. Well appearing and in no distress. Eyes: Normal exam ENT   Head: Normocephalic and atraumatic.   Mouth/Throat: Mucous membranes are moist. Cardiovascular: Normal rate, regular rhythm. No murmur Respiratory: Normal respiratory effort without tachypnea nor retractions. Breath sounds are clear  Gastrointestinal: Soft, no reaction to abdominal palpation. Musculoskeletal: Nontender with normal range of motion in all extremities.  Neurologic:  Normal speech and language. No gross focal neurologic deficits  Skin:  Skin is warm, dry and intact.  Psychiatric: Mood and affect are normal. Speech and behavior are normal.   ____________________________________________    EKG  EKG reviewed and interpreted by myself shows sinus tachycardia 104 bpm, widened QRS, normal axis, normal interval,  consistent with left bundle branch block. No concerning ST changes noted.  ____________________________________________    RADIOLOGY  Chest x-ray shows no acute abnormality  ____________________________________________   INITIAL IMPRESSION / ASSESSMENT AND PLAN / ED COURSE  Pertinent labs & imaging results that were available during my care of the patient were reviewed by me and considered in  my medical decision making (see chart for details).  Patient presents to the emergency department from nursing facility with a fever, tachycardia meeting sepsis criteria. Patient has no specific complaints. Family denies recent cough or congestion. Patient doesn't history of urinary tract infections in the past. Patient meets sepsis criteria, sepsis protocol has been initiated. We will check labs, begin empiric antibiotics, and begin IV fluid administration while awaiting lab results.   Labs are consistent with urinary tract infection. We'll admit to the hospital given altered mental status fever meeting sepsis criteria. The patient is being treated with IV antibiotics. Troponin is slightly elevated.   CRITICAL CARE Performed by: Harvest Dark   Total critical care time: 45 minutes  Critical care time was exclusive of separately billable procedures and treating other patients.  Critical care was necessary to treat or prevent imminent or life-threatening deterioration.  Critical care was time spent personally by me on the following activities: development of treatment plan with patient and/or surrogate as well as nursing, discussions with consultants, evaluation of patient's response to treatment, examination of patient, obtaining history from patient or surrogate, ordering and performing treatments and interventions, ordering and review of laboratory studies, ordering and review of radiographic studies, pulse oximetry and re-evaluation of patient's  condition.      ____________________________________________   FINAL CLINICAL IMPRESSION(S) / ED DIAGNOSES  Sepsis   Harvest Dark, MD 06/14/15 2210

## 2015-06-14 NOTE — ED Notes (Signed)
Patient here from assisted living. Staff states AMS, possible UTI

## 2015-06-15 ENCOUNTER — Encounter: Payer: Self-pay | Admitting: Internal Medicine

## 2015-06-15 ENCOUNTER — Inpatient Hospital Stay: Payer: Medicare Other

## 2015-06-15 LAB — BASIC METABOLIC PANEL
ANION GAP: 8 (ref 5–15)
BUN: 18 mg/dL (ref 6–20)
CHLORIDE: 106 mmol/L (ref 101–111)
CO2: 26 mmol/L (ref 22–32)
Calcium: 7.8 mg/dL — ABNORMAL LOW (ref 8.9–10.3)
Creatinine, Ser: 1.1 mg/dL — ABNORMAL HIGH (ref 0.44–1.00)
GFR calc Af Amer: 50 mL/min — ABNORMAL LOW (ref >60)
GFR, EST NON AFRICAN AMERICAN: 44 mL/min — AB (ref >60)
GLUCOSE: 112 mg/dL — AB (ref 65–99)
POTASSIUM: 3.6 mmol/L (ref 3.5–5.1)
Sodium: 140 mmol/L (ref 135–145)

## 2015-06-15 LAB — LACTIC ACID, PLASMA: Lactic Acid, Venous: 0.9 mmol/L (ref 0.5–2.0)

## 2015-06-15 LAB — BLOOD CULTURE ID PANEL (REFLEXED)
ACINETOBACTER BAUMANNII: NOT DETECTED
CANDIDA ALBICANS: NOT DETECTED
CANDIDA PARAPSILOSIS: NOT DETECTED
Candida glabrata: NOT DETECTED
Candida krusei: NOT DETECTED
Candida tropicalis: NOT DETECTED
Carbapenem resistance: NOT DETECTED
ENTEROBACTERIACEAE SPECIES: DETECTED — AB
ENTEROCOCCUS SPECIES: NOT DETECTED
Enterobacter cloacae complex: NOT DETECTED
Escherichia coli: DETECTED — AB
HAEMOPHILUS INFLUENZAE: NOT DETECTED
KLEBSIELLA OXYTOCA: NOT DETECTED
Klebsiella pneumoniae: NOT DETECTED
LISTERIA MONOCYTOGENES: NOT DETECTED
METHICILLIN RESISTANCE: NOT DETECTED
Neisseria meningitidis: NOT DETECTED
PSEUDOMONAS AERUGINOSA: NOT DETECTED
Proteus species: NOT DETECTED
STAPHYLOCOCCUS AUREUS BCID: NOT DETECTED
STAPHYLOCOCCUS SPECIES: NOT DETECTED
STREPTOCOCCUS AGALACTIAE: NOT DETECTED
STREPTOCOCCUS SPECIES: NOT DETECTED
Serratia marcescens: NOT DETECTED
Streptococcus pneumoniae: NOT DETECTED
Streptococcus pyogenes: NOT DETECTED
VANCOMYCIN RESISTANCE: NOT DETECTED

## 2015-06-15 LAB — CBC
HEMATOCRIT: 31.9 % — AB (ref 35.0–47.0)
HEMOGLOBIN: 10.5 g/dL — AB (ref 12.0–16.0)
MCH: 31.3 pg (ref 26.0–34.0)
MCHC: 32.9 g/dL (ref 32.0–36.0)
MCV: 95.1 fL (ref 80.0–100.0)
Platelets: 149 10*3/uL — ABNORMAL LOW (ref 150–440)
RBC: 3.36 MIL/uL — ABNORMAL LOW (ref 3.80–5.20)
RDW: 15.7 % — AB (ref 11.5–14.5)
WBC: 6.6 10*3/uL (ref 3.6–11.0)

## 2015-06-15 LAB — MRSA PCR SCREENING: MRSA by PCR: NEGATIVE

## 2015-06-15 MED ORDER — DONEPEZIL HCL 5 MG PO TABS: 10.0000 mg | ORAL_TABLET | Freq: Every day | ORAL | Status: DC

## 2015-06-15 MED ORDER — OXYCODONE-ACETAMINOPHEN 5-325 MG PO TABS: 1.0000 | ORAL_TABLET | Freq: Four times a day (QID) | ORAL | Status: DC | PRN

## 2015-06-15 MED ORDER — FUROSEMIDE 10 MG/ML IJ SOLN
20.0000 mg | Freq: Two times a day (BID) | INTRAMUSCULAR | Status: DC
  Administered 2015-06-16 – 2015-06-18 (×5): 20 mg via INTRAVENOUS
  Filled 2015-06-15 (×7): qty 2

## 2015-06-15 MED ORDER — LEVOTHYROXINE SODIUM 50 MCG PO TABS
137.0000 ug | ORAL_TABLET | Freq: Every day | ORAL | Status: DC
  Administered 2015-06-15 – 2015-06-19 (×5): 137 ug via ORAL
  Filled 2015-06-15: qty 2
  Filled 2015-06-15 (×2): qty 3
  Filled 2015-06-15 (×2): qty 1

## 2015-06-15 MED ORDER — METOPROLOL TARTRATE 25 MG PO TABS
25.0000 mg | ORAL_TABLET | Freq: Two times a day (BID) | ORAL | Status: DC
  Administered 2015-06-15 – 2015-06-19 (×8): 25 mg via ORAL
  Filled 2015-06-15 (×8): qty 1

## 2015-06-15 MED ORDER — CEFTRIAXONE SODIUM 2 G IJ SOLR
2.0000 g | Freq: Once | INTRAMUSCULAR | Status: AC
  Administered 2015-06-15: 2 g via INTRAVENOUS
  Filled 2015-06-15: qty 2

## 2015-06-15 MED ORDER — ESTROGENS, CONJUGATED 0.625 MG/GM VA CREA
1.0000 | TOPICAL_CREAM | VAGINAL | Status: DC
  Administered 2015-06-15: 1 via VAGINAL
  Filled 2015-06-15: qty 30

## 2015-06-15 MED ORDER — DONEPEZIL HCL 5 MG PO TABS
5.0000 mg | ORAL_TABLET | Freq: Every morning | ORAL | Status: DC
  Administered 2015-06-16 – 2015-06-19 (×4): 5 mg via ORAL
  Filled 2015-06-15 (×4): qty 1

## 2015-06-15 MED ORDER — CARBIDOPA-LEVODOPA ER 50-200 MG PO TBCR
1.0000 | EXTENDED_RELEASE_TABLET | Freq: Two times a day (BID) | ORAL | Status: DC
  Administered 2015-06-15: 1 via ORAL
  Filled 2015-06-15 (×3): qty 1

## 2015-06-15 MED ORDER — FUROSEMIDE 10 MG/ML IJ SOLN
40.0000 mg | Freq: Once | INTRAMUSCULAR | Status: AC
  Administered 2015-06-15: 40 mg via INTRAVENOUS
  Filled 2015-06-15: qty 4

## 2015-06-15 MED ORDER — CITALOPRAM HYDROBROMIDE 20 MG PO TABS
20.0000 mg | ORAL_TABLET | Freq: Every day | ORAL | Status: DC
  Administered 2015-06-15 – 2015-06-19 (×5): 20 mg via ORAL
  Filled 2015-06-15 (×5): qty 1

## 2015-06-15 MED ORDER — ASPIRIN 81 MG PO CHEW
81.0000 mg | CHEWABLE_TABLET | Freq: Every day | ORAL | Status: DC
  Administered 2015-06-15 – 2015-06-19 (×5): 81 mg via ORAL
  Filled 2015-06-15 (×5): qty 1

## 2015-06-15 MED ORDER — NITROGLYCERIN 2 % TD OINT
1.0000 [in_us] | TOPICAL_OINTMENT | Freq: Four times a day (QID) | TRANSDERMAL | Status: DC
  Administered 2015-06-15 – 2015-06-19 (×13): 1 [in_us] via TOPICAL
  Filled 2015-06-15 (×14): qty 1

## 2015-06-15 MED ORDER — SODIUM CHLORIDE 0.9 % IV SOLN
1.0000 g | Freq: Two times a day (BID) | INTRAVENOUS | Status: DC
  Administered 2015-06-15 – 2015-06-16 (×2): 1 g via INTRAVENOUS
  Filled 2015-06-15 (×4): qty 1

## 2015-06-15 MED ORDER — ALPRAZOLAM 0.25 MG PO TABS
0.2500 mg | ORAL_TABLET | Freq: Two times a day (BID) | ORAL | Status: DC | PRN
  Administered 2015-06-15 – 2015-06-16 (×2): 0.25 mg via ORAL
  Filled 2015-06-15 (×2): qty 1

## 2015-06-15 MED ORDER — DARIFENACIN HYDROBROMIDE ER 7.5 MG PO TB24
7.5000 mg | ORAL_TABLET | Freq: Every day | ORAL | Status: DC
  Administered 2015-06-15 – 2015-06-18 (×4): 7.5 mg via ORAL
  Filled 2015-06-15 (×5): qty 1

## 2015-06-15 MED ORDER — DEXTROSE 5 % IV SOLN: 2.0000 g | INTRAVENOUS | Status: DC

## 2015-06-15 MED ORDER — CARBIDOPA-LEVODOPA ER 50-200 MG PO TBCR
1.0000 | EXTENDED_RELEASE_TABLET | Freq: Two times a day (BID) | ORAL | Status: DC
  Administered 2015-06-16 – 2015-06-19 (×7): 1 via ORAL
  Filled 2015-06-15 (×7): qty 1

## 2015-06-15 MED ORDER — PANTOPRAZOLE SODIUM 40 MG PO TBEC
40.0000 mg | DELAYED_RELEASE_TABLET | Freq: Every day | ORAL | Status: DC
  Administered 2015-06-15 – 2015-06-19 (×5): 40 mg via ORAL
  Filled 2015-06-15 (×5): qty 1

## 2015-06-15 MED ORDER — ATENOLOL 25 MG PO TABS
50.0000 mg | ORAL_TABLET | Freq: Every day | ORAL | Status: DC
  Administered 2015-06-15: 50 mg via ORAL
  Filled 2015-06-15: qty 2

## 2015-06-15 NOTE — Progress Notes (Signed)
Paged MD regarding medication orders. Verbal given to not give extra 20 of IV Lasix. MD informed of drop in sats during transfer to [facility]. No new orders given.

## 2015-06-15 NOTE — Progress Notes (Signed)
Dr. Humphrey Rolls stated he talked with the pts daughter at [place] office. MD ordered a 12 lead EKG.

## 2015-06-15 NOTE — Progress Notes (Signed)
MD placed orders for IV Lasix and Nitro patch. Pt's O2 is in the 90's on non-rebreather. MD stated to reassess in an hour to see how pt is doing. Will pass along to night shift RN

## 2015-06-15 NOTE — Progress Notes (Signed)
Pharmacy Antibiotic Note  Tina Gilbert is a 82 y.o. female admitted on 06/14/2015 with positive BCID for [facility]. Patient previously on Rocephin for GNR UTI .  Spoke with MD Mody about preliminary result. Will start [place] since BCID can't determine whethere the [place] species is ESBL.   Plan: BCID results were discussed with Dr. Benjie Karvonen and the patient's antibiotics will be changed to [facility] . Further narrowing will be determined based on finalized susceptibilities.  Height: 5\' 3"  (160 cm) Weight: 165 lb 1.3 oz (74.88 kg) IBW/kg (Calculated) : 52.4  Temp (24hrs), Avg:99 F (37.2 C), Min:97.8 F (36.6 C), Max:101.4 F (38.6 C)   Recent Labs Lab 06/14/15 1950 06/14/15 1955 06/14/15 2344 06/15/15 0500  WBC 9.1  --   --  6.6  CREATININE 1.17*  --   --  1.10*  LATICACIDVEN  --  0.9 0.9  --     Estimated Creatinine Clearance: 34.3 mL/min (by C-G formula based on Cr of 1.1).    Allergies  Allergen Reactions  . Avelox [Moxifloxacin Hcl In Nacl] Other (See Comments)    Reaction:  Positive ANA and bilateral iritis   . Celebrex [Celecoxib] Other (See Comments)    Reaction:  Positive ANA and bilateral iritis   . Memantine Hcl Other (See Comments)    confusion  . Tramadol Other (See Comments)    Reaction:  Hallucinations     Antimicrobials this admission: Rocephin 2/9 >> 2/9 Merrem 2/9 >>   Dose adjustments this admission:   Microbiology results:  BCx: Ecoli (see above)  UCx: 100k CFU GNR    Sputum:    MRSA PCR:   Thank you for allowing pharmacy to be a part of this patient's care.  Crestina Strike D 06/15/2015 12:27 PM

## 2015-06-15 NOTE — H&P (Signed)
[facility] [place] NAME: Tina Gilbert    MR#:  [id number]  DATE OF BIRTH:  Jan 08, 1925  DATE OF ADMISSION:  06/14/2015  PRIMARY CARE PHYSICIAN: BABAOFF, Caryl Bis, MD   REQUESTING/REFERRING PHYSICIAN: Dr. Kerman Passey  CHIEF COMPLAINT:   Chief Complaint  Patient presents with  . Altered Mental Status    HISTORY OF PRESENT ILLNESS:  Tina Gilbert  is a 82 y.o. female with a known history of dementia, hypertension resident of assisted living facility presents to the emergency room due to fever and confusion. She has been found to have a urinary tract infection. Patient has rectocele and cystocele and wears a pessary. Follows with gynecology closely. She was treated recently for urinary tract infection by her gynecologist at the [facility] antibiotic according to her daughter. She did not have any symptoms but was found to have bacteria and WBC on urinalysis that she gets routinely. Today she also has fever, weakness, confusion along with UTI. History obtained from old records and family. Patient unable to contribute to history due to dementia and worsening confusion.  PAST MEDICAL HISTORY:   Past Medical History  Diagnosis Date  . Hypertension   . Osteoporosis   . GERD (gastroesophageal reflux disease)   . Anxiety   . History of UTI   . Bladder irritability   . Asymmetrical breasts   . Hematuria   . Rectocele     moderate  . Pessary maintenance   . Cystocele     gellhorn 2 1/4   . Vaginal atrophy   . Parkinson disease ([place])   . Hypothyroidism   . Anemia   . Meningioma ([place])   . DDD (degenerative disc disease), cervical   . Dementia   . Alzheimer's dementia   . Parkinson's disease ([place])     PAST SURGICAL HISTORY:   Past Surgical History  Procedure Laterality Date  . Colon surgery    . Abdominal hysterectomy    . Total thyroidectomy    . Carpal tunnel release Bilateral   . Ovarian mass removal    . Colonoscopy with  propofol N/A 11/28/2014    Procedure: COLONOSCOPY WITH PROPOFOL;  Surgeon: Hulen Luster, MD;  Location[facility]COPY;  Service: Gastroenterology;  Laterality: N/A;  . Esophagogastroduodenoscopy (egd) with propofol N/A 11/28/2014    Procedure: ESOPHAGOGASTRODUODENOSCOPY (EGD) WITH PROPOFOL;  Surgeon: Hulen Luster, MD;  Location: [facility] ENDOSCOPY;  Service: Gastroenterology;  Laterality: N/A;    SOCIAL HISTORY:   Social History  Substance Use Topics  . Smoking status: Never Smoker   . Smokeless tobacco: Never Used  . Alcohol Use: No    FAMILY HISTORY:   Family History  Problem Relation Age of Onset  . CAD      DRUG ALLERGIES:   Allergies  Allergen Reactions  . Avelox [Moxifloxacin Hcl In Nacl] Other (See Comments)    Reaction:  Positive ANA and bilateral iritis   . Celebrex [Celecoxib] Other (See Comments)    Reaction:  Positive ANA and bilateral iritis   . Memantine Hcl Other (See Comments)    confusion  . Tramadol Other (See Comments)    Reaction:  Hallucinations     REVIEW OF SYSTEMS:   Review of Systems  Unable to perform ROS: dementia    MEDICATIONS AT HOME:   Prior to Admission medications   Medication Sig Start Date End Date Taking? Authorizing Provider  acetaminophen (TYLENOL) 325 MG tablet Take 650 mg by mouth  every 6 (six) hours as needed.   Yes Historical Provider, MD  ALPRAZolam Duanne Moron) 0.25 MG tablet Take 0.25 mg by mouth at bedtime as needed.  03/29/15  Yes Historical Provider, MD  aspirin 81 MG chewable tablet Chew 81 mg by mouth daily.   Yes Historical Provider, MD  atenolol (TENORMIN) 50 MG tablet Take 50 mg by mouth daily.   Yes Historical Provider, MD  carbidopa-levodopa (SINEMET CR) 50-200 MG per tablet Take 1 tablet by mouth 2 (two) times daily.   Yes Historical Provider, MD  citalopram (CELEXA) 20 MG tablet Take 20 mg by mouth daily. Reported on 06/14/2015   Yes Historical Provider, MD  conjugated estrogens (PREMARIN) vaginal cream Place 1 Applicatorful  vaginally 2 (two) times a week.   Yes Historical Provider, MD  donepezil (ARICEPT) 5 MG tablet Reported on 06/14/2015 10/13/14  Yes Historical Provider, MD  levothyroxine (SYNTHROID, LEVOTHROID) 137 MCG tablet Take 137 mcg by mouth daily before breakfast. Reported on 06/14/2015   Yes Historical Provider, MD  omeprazole (PRILOSEC) 20 MG capsule Take 20 mg by mouth daily. Reported on 06/14/2015   Yes Historical Provider, MD  oxyCODONE-acetaminophen (ROXICET) 5-325 MG tablet Take 1 tablet by mouth every 6 (six) hours as needed for severe pain. 05/16/15  Yes Earleen Newport, MD  solifenacin (VESICARE) 5 MG tablet Take 5 mg by mouth daily. Reported on 06/14/2015   Yes Historical Provider, MD  triamterene-hydrochlorothiazide (MAXZIDE-25) 37.5-25 MG per tablet Take 0.5 tablets by mouth daily. Reported on 06/14/2015   Yes Historical Provider, MD  TRIMO-SAN 0.025 % GEL USE AS DIRECTED 01/30/15  Yes Alanda Slim Defrancesco, MD  Wheat Dextrin (BENEFIBER DRINK MIX PO) Take 1 Dose by mouth daily. Reported on 06/14/2015   Yes Historical Provider, MD  Cholecalciferol (VITAMIN D) 2000 UNITS tablet Take 2,000 Units by mouth daily. Reported on 06/14/2015    Historical Provider, MD      VITAL SIGNS:  Blood pressure 114/48, pulse 71, temperature 97.8 F (36.6 C), temperature source Oral, resp. rate 20, height 5\' 3"  (1.6 m), weight 74.889 kg (165 lb 1.6 oz), SpO2 96 %.  PHYSICAL EXAMINATION:  Physical Exam  GENERAL:  82 y.o.-year-old patient lying in the bed with no acute distress.  EYES: Pupils equal, round, reactive to light and accommodation. No scleral icterus. Extraocular muscles intact.  HEENT: Head atraumatic, normocephalic. Oropharynx and nasopharynx clear. No oropharyngeal erythema, moist oral mucosa  NECK:  Supple, no jugular venous distention. No thyroid enlargement, no tenderness.  LUNGS: Normal breath sounds bilaterally, no wheezing, rales, rhonchi. No use of accessory muscles of respiration.  CARDIOVASCULAR: S1, S2  normal. No murmurs, rubs, or gallops.  ABDOMEN: Soft, nontender, nondistended. Bowel sounds present. No organomegaly or mass.  EXTREMITIES: Moving all 4 extremities. Patient doesn't follow commands.  NEUROLOGIC: Cranial nerves II through XII are intact. No focal Motor or sensory deficits appreciated b/l PSYCHIATRIC: The patient is awake and pleasantly confused. SKIN: No obvious rash, lesion, or ulcer.   LABORATORY PANEL:   CBC  Recent Labs Lab 06/14/15 1950  WBC 9.1  HGB 10.6*  HCT 31.9*  PLT 200   ------------------------------------------------------------------------------------------------------------------  Chemistries   Recent Labs Lab 06/14/15 1950  NA 134*  K 3.5  CL 99*  CO2 25  GLUCOSE 138*  BUN 22*  CREATININE 1.17*  CALCIUM 8.2*  AST 18  ALT 7*  ALKPHOS 53  BILITOT 0.8   ------------------------------------------------------------------------------------------------------------------  Cardiac Enzymes  Recent Labs Lab 06/14/15 1950  TROPONINI 0.05*   ------------------------------------------------------------------------------------------------------------------  RADIOLOGY:  Dg Chest Port 1 View  06/14/2015  CLINICAL DATA:  Fever, altered mental status. EXAM: PORTABLE CHEST 1 VIEW COMPARISON:  April 02, 2015. FINDINGS: The heart size and mediastinal contours are within normal limits. Both lungs are clear. No pneumothorax or pleural effusion is noted. The visualized skeletal structures are unremarkable. IMPRESSION: No acute cardiopulmonary abnormality seen. Electronically Signed   By: Marijo Conception, M.D.   On: 06/14/2015 20:56     IMPRESSION AND PLAN:   * UTI with acute encephalopathy Start IV ceftriaxone. IV fluids. Urine cultures sent. Fall precautions.  * Dementia High risk for inpatient delirium. Monitor  * Hypertension Continue patient's atenolol. Held [place].  * Anemia of chronic disease is stable  * DVT prophylaxis with  Lovenox    All the records are reviewed and case discussed with ED provider. Management plans discussed with the patient, family and they are in agreement.  CODE STATUS: FULL  TOTAL TIME TAKING CARE OF THIS PATIENT: 45 minutes.    Hillary Bow R M.D on 06/15/2015 at 1:21 AM  Between 7am to 6pm - Pager - [phone]  After 6pm go to [email or web address] - password [email or web address] [facility]  Office  [phone]  CC: Primary care physician; BABAOFF, Caryl Bis, MD   Note: This dictation was prepared with Dragon dictation along with smaller phrase technology. Any transcriptional errors that result from this process are unintentional.

## 2015-06-15 NOTE — Progress Notes (Signed)
Tina Gilbert is a 82 y.o. female  [id number]  Primary Cardiologist: Neoma Laming Reason for Consultation: Pauses and sinus bradycardia  HPI: This 82 year old white female with a past medical history hypertension dementia who lives at assisted living facility in [place] called [facility]. Presented to the hospital with weakness dizziness fever and confusion. I spoke to her daughter who is a nurse with Dr. Daine Floras, who states that she had 102 temperature shortness of breath weakness and was not herself. I was called to evaluate the patient because of bradycardia and pauses but patient is on Tenormin 50 mg once a day.   Review of Systems: No chest pain no shortness of breath   Past Medical History  Diagnosis Date  . Hypertension   . Osteoporosis   . GERD (gastroesophageal reflux disease)   . Anxiety   . History of UTI   . Bladder irritability   . Asymmetrical breasts   . Hematuria   . Rectocele     moderate  . Pessary maintenance   . Cystocele     gellhorn 2 1/4   . Vaginal atrophy   . Parkinson disease ([place])   . Hypothyroidism   . Anemia   . Meningioma ([place])   . DDD (degenerative disc disease), cervical   . Dementia   . Alzheimer's dementia   . Parkinson's disease ([place])     Medications Prior to Admission  Medication Sig Dispense Refill  . acetaminophen (TYLENOL) 325 MG tablet Take 650 mg by mouth every 6 (six) hours as needed.    . ALPRAZolam (XANAX) 0.25 MG tablet Take 0.25 mg by mouth at bedtime as needed.     Marland Kitchen aspirin 81 MG chewable tablet Chew 81 mg by mouth daily.    Marland Kitchen atenolol (TENORMIN) 50 MG tablet Take 50 mg by mouth daily.    . carbidopa-levodopa (SINEMET CR) 50-200 MG per tablet Take 1 tablet by mouth 2 (two) times daily.    . citalopram (CELEXA) 20 MG tablet Take 20 mg by mouth daily. Reported on 06/14/2015    . conjugated estrogens (PREMARIN) vaginal cream Place 1 Applicatorful vaginally 2 (two) times a week.    . donepezil (ARICEPT) 5 MG tablet  Reported on 06/14/2015    . levothyroxine (SYNTHROID, LEVOTHROID) 137 MCG tablet Take 137 mcg by mouth daily before breakfast. Reported on 06/14/2015    . omeprazole (PRILOSEC) 20 MG capsule Take 20 mg by mouth daily. Reported on 06/14/2015    . oxyCODONE-acetaminophen (ROXICET) 5-325 MG tablet Take 1 tablet by mouth every 6 (six) hours as needed for severe pain. 12 tablet 0  . solifenacin (VESICARE) 5 MG tablet Take 5 mg by mouth daily. Reported on 06/14/2015    . triamterene-hydrochlorothiazide (MAXZIDE-25) 37.5-25 MG per tablet Take 0.5 tablets by mouth daily. Reported on 06/14/2015    . TRIMO-SAN 0.025 % GEL USE AS DIRECTED 113.4 g 3  . Wheat Dextrin (BENEFIBER DRINK MIX PO) Take 1 Dose by mouth daily. Reported on 06/14/2015    . Cholecalciferol (VITAMIN D) 2000 UNITS tablet Take 2,000 Units by mouth daily. Reported on 06/14/2015       . aspirin  81 mg Oral Daily  . atenolol  50 mg Oral Daily  . carbidopa-levodopa  1 tablet Oral BID  . citalopram  20 mg Oral Daily  . conjugated estrogens  1 Applicatorful Vaginal Once per day on Mon Thu  . darifenacin  7.5 mg Oral Daily  . donepezil  10 mg  Oral QHS  . enoxaparin (LOVENOX) injection  40 mg Subcutaneous Q24H  . levothyroxine  137 mcg Oral QAC breakfast  . meropenem (MERREM) IV  1 g Intravenous Q12H  . pantoprazole  40 mg Oral Daily    Infusions:    Allergies  Allergen Reactions  . Avelox [Moxifloxacin Hcl In Nacl] Other (See Comments)    Reaction:  Positive ANA and bilateral iritis   . Celebrex [Celecoxib] Other (See Comments)    Reaction:  Positive ANA and bilateral iritis   . Memantine Hcl Other (See Comments)    confusion  . Tramadol Other (See Comments)    Reaction:  Hallucinations     Social History   Social History  . Marital Status: Widowed    Spouse Name: N/A  . Number of Children: N/A  . Years of Education: N/A   Occupational History  . Not on file.   Social History Main Topics  . Smoking status: Never Smoker   .  Smokeless tobacco: Never Used  . Alcohol Use: No  . Drug Use: No  . Sexual Activity: No   Other Topics Concern  . Not on file   Social History Narrative    Family History  Problem Relation Age of Onset  . CAD      PHYSICAL EXAM: Filed Vitals:   06/15/15 1029 06/15/15 1334  BP: 121/56 173/66  Pulse: 72 79  Temp: 98.6 F (37 C) 98.2 F (36.8 C)  Resp: 18 16     Intake/Output Summary (Last 24 hours) at 06/15/15 1621 Last data filed at 06/15/15 1300  Gross per 24 hour  Intake   1465 ml  Output    200 ml  Net   1265 ml    General:  Well appearing. No respiratory difficulty HEENT: normal Neck: supple. no JVD. Carotids 2+ bilat; no bruits. No lymphadenopathy or thryomegaly appreciated. Cor: PMI nondisplaced. Regular rate & rhythm. No rubs, gallops or murmurs. Lungs: clear Abdomen: soft, nontender, nondistended. No hepatosplenomegaly. No bruits or masses. Good bowel sounds. Extremities: no cyanosis, clubbing, rash, edema Neuro: alert & oriented x 3, cranial nerves grossly intact. moves all 4 extremities w/o difficulty. Affect pleasant.  ECG: EKG done just now shows sinus tachycardia with frequent PVCs and left bundle branch block.  Results for orders placed or performed during the hospital encounter of 06/14/15 (from the past 24 hour(s))  Urinalysis complete, with microscopic ([facility] only)     Status: Abnormal   Collection Time: 06/14/15  7:50 PM  Result Value Ref Range   Color, Urine YELLOW (A) YELLOW   APPearance CLOUDY (A) CLEAR   Glucose, UA NEGATIVE NEGATIVE mg/dL   Bilirubin Urine NEGATIVE NEGATIVE   Ketones, ur TRACE (A) NEGATIVE mg/dL   Specific Gravity, Urine 1.014 1.005 - 1.030   Hgb urine dipstick 2+ (A) NEGATIVE   pH 5.0 5.0 - 8.0   Protein, ur 100 (A) NEGATIVE mg/dL   Nitrite NEGATIVE NEGATIVE   Leukocytes, UA 3+ (A) NEGATIVE   RBC / HPF 0-5 0 - 5 RBC/hpf   WBC, UA TOO NUMEROUS TO COUNT 0 - 5 WBC/hpf   Bacteria, UA RARE (A) NONE SEEN   Squamous  Epithelial / LPF 0-5 (A) NONE SEEN   WBC Clumps PRESENT   Urine culture     Status: None (Preliminary result)   Collection Time: 06/14/15  7:50 PM  Result Value Ref Range   Specimen Description URINE, RANDOM    Special Requests NONE    Culture      >=  100,000 COLONIES/mL GRAM NEGATIVE RODS IDENTIFICATION AND SUSCEPTIBILITIES TO FOLLOW    Report Status PENDING   Comprehensive metabolic panel     Status: Abnormal   Collection Time: 06/14/15  7:50 PM  Result Value Ref Range   Sodium 134 (L) 135 - 145 mmol/L   Potassium 3.5 3.5 - 5.1 mmol/L   Chloride 99 (L) 101 - 111 mmol/L   CO2 25 22 - 32 mmol/L   Glucose, Bld 138 (H) 65 - 99 mg/dL   BUN 22 (H) 6 - 20 mg/dL   Creatinine, Ser 1.17 (H) 0.44 - 1.00 mg/dL   Calcium 8.2 (L) 8.9 - 10.3 mg/dL   Total Protein 6.8 6.5 - 8.1 g/dL   Albumin 3.3 (L) 3.5 - 5.0 g/dL   AST 18 15 - 41 U/L   ALT 7 (L) 14 - 54 U/L   Alkaline Phosphatase 53 38 - 126 U/L   Total Bilirubin 0.8 0.3 - 1.2 mg/dL   GFR calc non Af Amer 40 (L) >60 mL/min   GFR calc Af Amer 47 (L) >60 mL/min   Anion gap 10 5 - 15  CBC WITH DIFFERENTIAL     Status: Abnormal   Collection Time: 06/14/15  7:50 PM  Result Value Ref Range   WBC 9.1 3.6 - 11.0 K/uL   RBC 3.38 (L) 3.80 - 5.20 MIL/uL   Hemoglobin 10.6 (L) 12.0 - 16.0 g/dL   HCT 31.9 (L) 35.0 - 47.0 %   MCV 94.6 80.0 - 100.0 fL   MCH 31.4 26.0 - 34.0 pg   MCHC 33.1 32.0 - 36.0 g/dL   RDW 15.9 (H) 11.5 - 14.5 %   Platelets 200 150 - 440 K/uL   Neutrophils Relative % 75 %   Neutro Abs 6.9 (H) 1.4 - 6.5 K/uL   Lymphocytes Relative 14 %   Lymphs Abs 1.3 1.0 - 3.6 K/uL   Monocytes Relative 10 %   Monocytes Absolute 0.9 0.2 - 0.9 K/uL   Eosinophils Relative 0 %   Eosinophils Absolute 0.0 0 - 0.7 K/uL   Basophils Relative 1 %   Basophils Absolute 0.1 0 - 0.1 K/uL  Troponin I     Status: Abnormal   Collection Time: 06/14/15  7:50 PM  Result Value Ref Range   Troponin I 0.05 (H) <0.031 ng/mL  Blood Culture (routine x 2)      Status: None (Preliminary result)   Collection Time: 06/14/15  7:52 PM  Result Value Ref Range   Specimen Description BLOOD RIGHT ASSIST CONTROL    Special Requests BOTTLES DRAWN AEROBIC AND ANAEROBIC  5CC    Culture  Setup Time      GRAM NEGATIVE RODS AEROBIC BOTTLE ONLY CRITICAL RESULT CALLED TO, READ BACK BY AND VERIFIED WITH: KAREN HAYES 06/15/15 1207 MLM Organism ID to follow    Culture ESCHERICHIA COLI AEROBIC BOTTLE ONLY     Report Status PENDING   Blood Culture ID Panel (Reflexed)     Status: Abnormal   Collection Time: 06/14/15  7:52 PM  Result Value Ref Range   Enterococcus species NOT DETECTED NOT DETECTED   Listeria monocytogenes NOT DETECTED NOT DETECTED   Staphylococcus species NOT DETECTED NOT DETECTED   Staphylococcus aureus NOT DETECTED NOT DETECTED   Streptococcus species NOT DETECTED NOT DETECTED   Streptococcus agalactiae NOT DETECTED NOT DETECTED   Streptococcus pneumoniae NOT DETECTED NOT DETECTED   Streptococcus pyogenes NOT DETECTED NOT DETECTED   Acinetobacter baumannii NOT DETECTED NOT DETECTED  Enterobacteriaceae species DETECTED (A) NOT DETECTED   Enterobacter cloacae complex NOT DETECTED NOT DETECTED   Escherichia coli DETECTED (A) NOT DETECTED   Klebsiella oxytoca NOT DETECTED NOT DETECTED   Klebsiella pneumoniae NOT DETECTED NOT DETECTED   Proteus species NOT DETECTED NOT DETECTED   Serratia marcescens NOT DETECTED NOT DETECTED   Haemophilus influenzae NOT DETECTED NOT DETECTED   Neisseria meningitidis NOT DETECTED NOT DETECTED   Pseudomonas aeruginosa NOT DETECTED NOT DETECTED   Candida albicans NOT DETECTED NOT DETECTED   Candida glabrata NOT DETECTED NOT DETECTED   Candida krusei NOT DETECTED NOT DETECTED   Candida parapsilosis NOT DETECTED NOT DETECTED   Candida tropicalis NOT DETECTED NOT DETECTED   Carbapenem resistance NOT DETECTED NOT DETECTED   Methicillin resistance NOT DETECTED NOT DETECTED   Vancomycin resistance NOT DETECTED NOT  DETECTED  Lactic acid, plasma     Status: None   Collection Time: 06/14/15  7:55 PM  Result Value Ref Range   Lactic Acid, Venous 0.9 0.5 - 2.0 mmol/L  Lactic acid, plasma     Status: None   Collection Time: 06/14/15 11:44 PM  Result Value Ref Range   Lactic Acid, Venous 0.9 0.5 - 2.0 mmol/L  MRSA PCR Screening     Status: None   Collection Time: 06/15/15 12:47 AM  Result Value Ref Range   MRSA by PCR NEGATIVE NEGATIVE  Basic metabolic panel     Status: Abnormal   Collection Time: 06/15/15  5:00 AM  Result Value Ref Range   Sodium 140 135 - 145 mmol/L   Potassium 3.6 3.5 - 5.1 mmol/L   Chloride 106 101 - 111 mmol/L   CO2 26 22 - 32 mmol/L   Glucose, Bld 112 (H) 65 - 99 mg/dL   BUN 18 6 - 20 mg/dL   Creatinine, Ser 1.10 (H) 0.44 - 1.00 mg/dL   Calcium 7.8 (L) 8.9 - 10.3 mg/dL   GFR calc non Af Amer 44 (L) >60 mL/min   GFR calc Af Amer 50 (L) >60 mL/min   Anion gap 8 5 - 15  CBC     Status: Abnormal   Collection Time: 06/15/15  5:00 AM  Result Value Ref Range   WBC 6.6 3.6 - 11.0 K/uL   RBC 3.36 (L) 3.80 - 5.20 MIL/uL   Hemoglobin 10.5 (L) 12.0 - 16.0 g/dL   HCT 31.9 (L) 35.0 - 47.0 %   MCV 95.1 80.0 - 100.0 fL   MCH 31.3 26.0 - 34.0 pg   MCHC 32.9 32.0 - 36.0 g/dL   RDW 15.7 (H) 11.5 - 14.5 %   Platelets 149 (L) 150 - 440 K/uL   Dg Chest Port 1 View  06/14/2015  CLINICAL DATA:  Fever, altered mental status. EXAM: PORTABLE CHEST 1 VIEW COMPARISON:  April 02, 2015. FINDINGS: The heart size and mediastinal contours are within normal limits. Both lungs are clear. No pneumothorax or pleural effusion is noted. The visualized skeletal structures are unremarkable. IMPRESSION: No acute cardiopulmonary abnormality seen. Electronically Signed   By: Marijo Conception, M.D.   On: 06/14/2015 20:56     ASSESSMENT AND PLAN: Patient has urosepsis with Escherichia coli and urine/blood. Artery mental status probably related to urinary tract infection. I reviewed on the monitor patient's  rhythm which showed maybe 1.5 second pauses with occasional atrial fibrillation and sinus rhythm. At no point there worse causes more than 1.5 second and currently patient is in sinus tachycardia. Plus patient is a nursing  home patient with a history of dementia and Parkinson's and is probably not a candidate for pacemaker and should be made DO NOT RESUSCITATE. Advise discontinuation of Tenormin and changing to metoprolol 25 by mouth twice a day. Also will get echocardiogram to evaluate her ejection fraction.   KHAN,SHAUKAT A to the top

## 2015-06-15 NOTE — Clinical Social Work Note (Signed)
Clinical Social Work Assessment  Patient Details  Name: Tina Gilbert MRN: [id number] Date of Birth: [birth year]  Date of referral:  06/15/15               Reason for consult:  Facility Placement                Permission sought to share information with:  Facility Art therapist granted to share information::  Yes, Verbal Permission Granted  Name::        Agency::     Relationship::     Contact Information:     Housing/Transportation Living arrangements for the past 2 months:  [place] of Information:  Patient, Adult Children Patient Interpreter Needed:  None Criminal Activity/Legal Involvement Pertinent to Current Situation/Hospitalization:  No - Comment as needed Significant Relationships:  Adult Children Lives with:    Do you feel safe going back to the place where you live?  Yes Need for family participation in patient care:  Yes (Comment)  Care giving concerns:  Patient resides at [facility].   Social Worker assessment / plan:  CSW met with patient and her daughter this afternoon. Patient's daughter reports that she does wish for her mother to return to [facility] at discharge. CSW explained that the physician was possibly considering discharging her back to [facility] tomorrow and patient's daughter verbalized understanding. CSW spoke with Thayer Headings at [facility] and she can take patient back at discharge and is aware it may be tomorrow.  FL2 begun in epic.  Employment status:  Retired Nurse, adult PT Recommendations:  Not assessed at this time Information / Referral to community resources:     Patient/Family's Response to care:  Patient and daughter expressed appreciation for [facility] visit.  Patient/Family's Understanding of and Emotional Response to Diagnosis, Current Treatment, and Prognosis:  Patient is happy at [facility] and states she plays the piano all the time at [facility] and really loves it.    Emotional Assessment Appearance:  Appears stated age Attitude/Demeanor/Rapport:   (pleasantly confused) Affect (typically observed):  Calm, Pleasant Orientation:  Oriented to Self Alcohol / Substance use:  Not Applicable Psych involvement (Current and /or in the community):  No (Comment)  Discharge Needs  Concerns to be addressed:  Care Coordination Readmission within the last 30 days:  No Current discharge risk:  None Barriers to Discharge:  No Barriers Identified   Shela Leff, LCSW 06/15/2015, 2:09 PM

## 2015-06-15 NOTE — Plan of Care (Signed)
Problem: Safety: Goal: Ability to remain free from injury will improve Outcome: Progressing Patient was not impulsive remained in the bed at all times.

## 2015-06-15 NOTE — Care Management (Signed)
Patient admitted from ALF.  CSW following.  RNCM signing off.  Available for discharge plan if needed

## 2015-06-15 NOTE — Progress Notes (Signed)
[facility] [place] NAME: Tina Gilbert    MR#:  [id number]  DATE OF BIRTH:  May 17, 1924  SUBJECTIVE:   Patient is pleasantly confused with dementia. She voices no pain. Family is at bedside.  REVIEW OF SYSTEMS:    Review of Systems  Unable to perform ROS: dementia    Tolerating Diet: Yes      DRUG ALLERGIES:   Allergies  Allergen Reactions  . Avelox [Moxifloxacin Hcl In Nacl] Other (See Comments)    Reaction:  Positive ANA and bilateral iritis   . Celebrex [Celecoxib] Other (See Comments)    Reaction:  Positive ANA and bilateral iritis   . Memantine Hcl Other (See Comments)    confusion  . Tramadol Other (See Comments)    Reaction:  Hallucinations     VITALS:  Blood pressure 121/56, pulse 72, temperature 98.6 F (37 C), temperature source Oral, resp. rate 18, height 5\' 3"  (1.6 m), weight 74.88 kg (165 lb 1.3 oz), SpO2 99 %.  PHYSICAL EXAMINATION:   Physical Exam    LABORATORY PANEL:   CBC  Recent Labs Lab 06/15/15 0500  WBC 6.6  HGB 10.5*  HCT 31.9*  PLT 149*   ------------------------------------------------------------------------------------------------------------------  Chemistries   Recent Labs Lab 06/14/15 1950 06/15/15 0500  NA 134* 140  K 3.5 3.6  CL 99* 106  CO2 25 26  GLUCOSE 138* 112*  BUN 22* 18  CREATININE 1.17* 1.10*  CALCIUM 8.2* 7.8*  AST 18  --   ALT 7*  --   ALKPHOS 53  --   BILITOT 0.8  --    ------------------------------------------------------------------------------------------------------------------  Cardiac Enzymes  Recent Labs Lab 06/14/15 1950  TROPONINI 0.05*   ------------------------------------------------------------------------------------------------------------------  RADIOLOGY:  Dg Chest Port 1 View  06/14/2015  CLINICAL DATA:  Fever, altered mental status. EXAM: PORTABLE CHEST 1 VIEW COMPARISON:  April 02, 2015. FINDINGS: The heart  size and mediastinal contours are within normal limits. Both lungs are clear. No pneumothorax or pleural effusion is noted. The visualized skeletal structures are unremarkable. IMPRESSION: No acute cardiopulmonary abnormality seen. Electronically Signed   By: Marijo Conception, M.D.   On: 06/14/2015 20:56     ASSESSMENT AND PLAN:   82 year old female with a history of dementia, rectocele, pessary and essential hypertension who presents with altered mental status.  1. Acute metabolic encephalopathy: This is secondary to urinary tract infection. As per family at bedside, she is very close to her baseline.  2. Acute cystitis without hematuria: Continue Rocephin and follow up on urine culture.  3. Dementia: Continue donepezil  4. Hypothyroid: Continue Synthroid.  5. Essential hypertension: Continue atenolol.   Management plans discussed with the patient's daughter in law and she is in agreement.  CODE STATUS: FULL  TOTAL TIME TAKING CARE OF THIS PATIENT: 30 minutes.     POSSIBLE D/C tomorrow, DEPENDING ON CLINICAL CONDITION.   Tina Gilbert M.D on 06/15/2015 at 10:56 AM  Between 7am to 6pm - Pager - [phone] After 6pm go to [email or web address] - password [email or web address] [facility]  Office  [phone]  CC: Primary care physician; BABAOFF, Caryl Bis, MD  Note: This dictation was prepared with Dragon dictation along with smaller phrase technology. Any transcriptional errors that result from this process are unintentional.

## 2015-06-15 NOTE — Progress Notes (Signed)
Pt. RR 24, Hr 85, SPO2 100% on NRB, BBS fine crackles throughout. Pt family has several concerns about pt condition. Will try to wean pt SPO2 to remove from NRB and place on HFNC if possible. Will continue to speak with pt. RN to keep pt family abreast of plan of care for pt for tonight.

## 2015-06-15 NOTE — Plan of Care (Signed)
Problem: Pain Managment: Goal: General experience of comfort will improve Outcome: Completed/Met Date Met:  06/15/15 No c/o of pain noted on shift.

## 2015-06-15 NOTE — Progress Notes (Signed)
Pt had a BM and then had an episode of vomiting. Pt then became more shaky and speaking inn very short, choppy sentences. SPO2 was taken and found to be at 69% on RA. Pt was put on a non rebreather and came up to the 90s. Rt was called to come assess the pt. MD was also paged and gave orders for a STAT chest xray. Pt is tachycardic and has a temp of 102.2. Pt was given a Tylenol suppository. Will continue to assess.

## 2015-06-15 NOTE — Progress Notes (Signed)
2230, went to place pt on HFNC. Pt had removed the NRB and was in no distress with SPO2 in low 90's. Placed pt on [place] at 3L, SPO2 remains in mid 90's. Informed pt RN Erline Levine and Jeanette Caprice.

## 2015-06-15 NOTE — Plan of Care (Signed)
Problem: Education: Goal: Knowledge of [facility] Education information/materials will improve Outcome: Progressing Handouts given to patient r/t DX

## 2015-06-16 ENCOUNTER — Inpatient Hospital Stay
Admit: 2015-06-16 | Discharge: 2015-06-16 | Disposition: A | Discharge status: Home / Self Care | Payer: Medicare Other | Attending: Cardiovascular Disease | Admitting: Cardiovascular Disease

## 2015-06-16 LAB — URINE CULTURE: Culture: >=100000

## 2015-06-16 LAB — BASIC METABOLIC PANEL
ANION GAP: 9 (ref 5–15)
BUN: 20 mg/dL (ref 6–20)
CALCIUM: 8.1 mg/dL — AB (ref 8.9–10.3)
CO2: 27 mmol/L (ref 22–32)
Chloride: 102 mmol/L (ref 101–111)
Creatinine, Ser: 1.22 mg/dL — ABNORMAL HIGH (ref 0.44–1.00)
GFR calc Af Amer: 44 mL/min — ABNORMAL LOW (ref >60)
GFR, EST NON AFRICAN AMERICAN: 38 mL/min — AB (ref >60)
GLUCOSE: 118 mg/dL — AB (ref 65–99)
Potassium: 3.6 mmol/L (ref 3.5–5.1)
SODIUM: 138 mmol/L (ref 135–145)

## 2015-06-16 MED ORDER — DEXTROSE 5 % IV SOLN
1.0000 g | INTRAVENOUS | Status: DC
  Administered 2015-06-16 – 2015-06-17 (×2): 1 g via INTRAVENOUS
  Filled 2015-06-16 (×2): qty 10

## 2015-06-16 MED ORDER — DEXTROSE 5 % IV SOLN
500.0000 mg | INTRAVENOUS | Status: DC
  Administered 2015-06-16 – 2015-06-17 (×2): 500 mg via INTRAVENOUS
  Filled 2015-06-16 (×2): qty 500

## 2015-06-16 NOTE — Progress Notes (Addendum)
Tina Gilbert at [place] NAME: Tina Gilbert    MR#:  [id number]  DATE OF BIRTH:  04/18/1925  SUBJECTIVE:   Patient had episode of shortness of breath yesterday. Chest x-ray shows pneumonia and possible CHF.     Review of Systems  Unable to perform ROS: dementia    Tolerating Diet: Yes      DRUG ALLERGIES:   Allergies  Allergen Reactions  . Avelox [Moxifloxacin Hcl In Nacl] Other (See Comments)    Reaction:  Positive ANA and bilateral iritis   . Celebrex [Celecoxib] Other (See Comments)    Reaction:  Positive ANA and bilateral iritis   . Memantine Hcl Other (See Comments)    confusion  . Tramadol Other (See Comments)    Reaction:  Hallucinations     VITALS:  Blood pressure 157/76, pulse 76, temperature 98.9 F (37.2 C), temperature source Oral, resp. rate 23, height 5\' 3"  (1.6 m), weight 76.068 kg (167 lb 11.2 oz), SpO2 94 %.  PHYSICAL EXAMINATION:   Physical Exam  Constitutional: She is oriented to person, place, and time and well-developed, well-nourished, and in no distress. No distress.  HENT:  Head: Normocephalic.  Eyes: No scleral icterus.  Neck: Normal range of motion. Neck supple. No JVD present. No tracheal deviation present.  Cardiovascular: Normal rate, regular rhythm and normal heart sounds.  Exam reveals no gallop and no friction rub.   No murmur heard. Pulmonary/Chest: Effort normal. No respiratory distress. She has no wheezes. She has rales. She exhibits no tenderness.  Abdominal: Soft. Bowel sounds are normal. She exhibits no distension and no mass. There is no tenderness. There is no rebound and no guarding.  Musculoskeletal: Normal range of motion. She exhibits no edema.  Neurological: She is alert and oriented to person, place, and time.  Skin: Skin is warm. No rash noted. No erythema.  Psychiatric: Affect and judgment normal.      LABORATORY PANEL:   CBC  Recent Labs Lab 06/15/15 0500   WBC 6.6  HGB 10.5*  HCT 31.9*  PLT 149*   ------------------------------------------------------------------------------------------------------------------  Chemistries   Recent Labs Lab 06/14/15 1950  06/16/15 0431  NA 134*  < > 138  K 3.5  < > 3.6  CL 99*  < > 102  CO2 25  < > 27  GLUCOSE 138*  < > 118*  BUN 22*  < > 20  CREATININE 1.17*  < > 1.22*  CALCIUM 8.2*  < > 8.1*  AST 18  --   --   ALT 7*  --   --   ALKPHOS 53  --   --   BILITOT 0.8  --   --   < > = values in this interval not displayed. ------------------------------------------------------------------------------------------------------------------  Cardiac Enzymes  Recent Labs Lab 06/14/15 1950  TROPONINI 0.05*   ------------------------------------------------------------------------------------------------------------------  RADIOLOGY:  Dg Chest 1 View  06/15/2015  CLINICAL DATA:  Tachycardia and low oxygen. EXAM: CHEST 1 VIEW COMPARISON:  June 14, 2015 FINDINGS: The heart size and mediastinal contours are stable. There is interval developed patchy consolidation of the right mid and lung base. Increased pulmonary interstitium is identified bilaterally. There areprobable minimal bilateral pleural effusions. The visualized skeletal structures are unremarkable. IMPRESSION: Interstitial edema. Patchy consolidation of the right mid to lung base suspicious for pneumonia. Probable minimal bilateral pleural effusions Electronically Signed   By: Abelardo Diesel M.D.   On: 06/15/2015 18:11   Dg Chest Port 1  View  06/14/2015  CLINICAL DATA:  Fever, altered mental status. EXAM: PORTABLE CHEST 1 VIEW COMPARISON:  April 02, 2015. FINDINGS: The heart size and mediastinal contours are within normal limits. Both lungs are clear. No pneumothorax or pleural effusion is noted. The visualized skeletal structures are unremarkable. IMPRESSION: No acute cardiopulmonary abnormality seen. Electronically Signed   By: Marijo Conception, M.D.   On: 06/14/2015 20:56     ASSESSMENT AND PLAN:   82 year old female with a history of dementia, rectocele, pessary and essential hypertension who presents with altered mental status.  1. Acute metabolic encephalopathy: This is secondary to bacteremia. She is back to her baseline. 2. Acute cystitis without hematuria: Urine cultures positive for Escherichia coli. Continue Rocephin. Patient also has blood cultures positive for Escherichia coli.  Repeat blood cultures tomorrow to assure clearing. Bacteremias due to urinary tract infection.   3. Dementia: Continue donepezil  4. Hypothyroid: Continue Synthroid.  5. Essential hypertension: Continue atenolol.  6. Atrial fibrillation with 1-1/2 second pauses: Follow-up on echocardiogram  Continue metoprolol  No anticoagulation due to fall risk and dementia.   7. Acute hypoxic respiratory failure: X-ray shows interstitial edema with probable pneumonia.  Continue azithromycin and Rocephin.  Follow up on chest x-ray  Continue Lasix 20 IV twice a day.  Speech consult for evaluation of aspiration  Management plans discussed with the patient's daughters and they are in agreement.  CODE STATUS: FULL  TOTAL TIME TAKING CARE OF THIS PATIENT: 33 minutes.   Physical therapy consult for disposition  POSSIBLE D/C tomorrow, DEPENDING ON CLINICAL CONDITION.   Jaquaya Coyle M.D on 06/16/2015 at 10:32 AM  Between 7am to 6pm - Pager - [phone] After 6pm go to [email or web address] - password [email or web address] [facility]  Office  [phone]  CC: Primary care physician; BABAOFF, Caryl Bis, MD  Note: This dictation was prepared with Dragon dictation along with smaller phrase technology. Any transcriptional errors that result from this process are unintentional.

## 2015-06-16 NOTE — Progress Notes (Signed)
Pharmacy Antibiotic Follow-up Note  Tina Gilbert is a 82 y.o. year-old female admitted on 06/14/2015.  The patient is currently on day 2 of Meropenem for bacteremia.  Assessment/Plan: This patient's current antibiotics will be continued without adjustments.  Continue Meropenem 1 gm IV q12h based on renal function.  BCID with E.Coli-sensitivities pending.  Temp (24hrs), Avg:99.3 F (37.4 C), Min:97.3 F (36.3 C), Max:102.2 F (39 C)   Recent Labs Lab 06/14/15 1950 06/15/15 0500  WBC 9.1 6.6    Recent Labs Lab 06/14/15 1950 06/15/15 0500 06/16/15 0431  CREATININE 1.17* 1.10* 1.22*   Estimated Creatinine Clearance: 31.1 mL/min (by C-G formula based on Cr of 1.22).    Allergies  Allergen Reactions  . Avelox [Moxifloxacin Hcl In Nacl] Other (See Comments)    Reaction:  Positive ANA and bilateral iritis   . Celebrex [Celecoxib] Other (See Comments)    Reaction:  Positive ANA and bilateral iritis   . Memantine Hcl Other (See Comments)    confusion  . Tramadol Other (See Comments)    Reaction:  Hallucinations     Antimicrobials this admission: Anti-infectives    Start     Dose/Rate Route Frequency Ordered Stop   06/16/15 1000  cefTRIAXone (ROCEPHIN) 2 g in dextrose 5 % 50 mL IVPB  Status:  Discontinued     2 g 100 mL/hr over 30 Minutes Intravenous Every 24 hours 06/15/15 0246 06/15/15 1226   06/15/15 1230  meropenem (MERREM) 1 g in sodium chloride 0.9 % 100 mL IVPB     1 g 200 mL/hr over 30 Minutes Intravenous Every 12 hours 06/15/15 1226     06/15/15 0300  cefTRIAXone (ROCEPHIN) 2 g in dextrose 5 % 50 mL IVPB     2 g 100 mL/hr over 30 Minutes Intravenous  Once 06/15/15 0246 06/15/15 0437   06/14/15 2030  piperacillin-tazobactam (ZOSYN) IVPB 3.375 g     3.375 g 100 mL/hr over 30 Minutes Intravenous  Once 06/14/15 2028 06/14/15 2130   06/14/15 2030  vancomycin (VANCOCIN) IVPB 1000 mg/200 mL premix     1,000 mg 200 mL/hr over 60 Minutes Intravenous  Once 06/14/15 2028  06/14/15 2231      Levels/dose changes this admission: Continue Meropenem 1 gm IV q12h.   Microbiology results: Results for orders placed or performed during the hospital encounter of 06/14/15  Urine culture     Status: None (Preliminary result)   Collection Time: 06/14/15  7:50 PM  Result Value Ref Range Status   Specimen Description URINE, RANDOM  Final   Special Requests NONE  Final   Culture   Final    >=100,000 COLONIES/mL GRAM NEGATIVE RODS IDENTIFICATION AND SUSCEPTIBILITIES TO FOLLOW    Report Status PENDING  Incomplete  Blood Culture (routine x 2)     Status: None (Preliminary result)   Collection Time: 06/14/15  7:52 PM  Result Value Ref Range Status   Specimen Description BLOOD RIGHT ASSIST CONTROL  Final   Special Requests BOTTLES DRAWN AEROBIC AND ANAEROBIC  5CC  Final   Culture  Setup Time   Final    GRAM NEGATIVE RODS AEROBIC BOTTLE ONLY CRITICAL RESULT CALLED TO, READ BACK BY AND VERIFIED WITH: KAREN HAYES 06/15/15 1207 MLM    Culture   Final    ESCHERICHIA COLI AEROBIC BOTTLE ONLY SUSCEPTIBILITIES TO FOLLOW    Report Status PENDING  Incomplete  Blood Culture ID Panel (Reflexed)     Status: Abnormal   Collection Time: 06/14/15  7:52 PM  Result Value Ref Range Status   Enterococcus species NOT DETECTED NOT DETECTED Final   Listeria monocytogenes NOT DETECTED NOT DETECTED Final   Staphylococcus species NOT DETECTED NOT DETECTED Final   Staphylococcus aureus NOT DETECTED NOT DETECTED Final   Streptococcus species NOT DETECTED NOT DETECTED Final   Streptococcus agalactiae NOT DETECTED NOT DETECTED Final   Streptococcus pneumoniae NOT DETECTED NOT DETECTED Final   Streptococcus pyogenes NOT DETECTED NOT DETECTED Final   Acinetobacter baumannii NOT DETECTED NOT DETECTED Final   Enterobacteriaceae species DETECTED (A) NOT DETECTED Final    Comment: CRITICAL RESULT CALLED TO, READ BACK BY AND VERIFIED WITH: KAREN HAYES 06/15/15 1207 MLM    Enterobacter cloacae  complex NOT DETECTED NOT DETECTED Final   Escherichia coli DETECTED (A) NOT DETECTED Final    Comment: CRITICAL RESULT CALLED TO, READ BACK BY AND VERIFIED WITH: KAREN HAYES 06/15/15 1207 MLM    Klebsiella oxytoca NOT DETECTED NOT DETECTED Final   Klebsiella pneumoniae NOT DETECTED NOT DETECTED Final   Proteus species NOT DETECTED NOT DETECTED Final   Serratia marcescens NOT DETECTED NOT DETECTED Final   Haemophilus influenzae NOT DETECTED NOT DETECTED Final   Neisseria meningitidis NOT DETECTED NOT DETECTED Final   Pseudomonas aeruginosa NOT DETECTED NOT DETECTED Final   Candida albicans NOT DETECTED NOT DETECTED Final   Candida glabrata NOT DETECTED NOT DETECTED Final   Candida krusei NOT DETECTED NOT DETECTED Final   Candida parapsilosis NOT DETECTED NOT DETECTED Final   Candida tropicalis NOT DETECTED NOT DETECTED Final   Carbapenem resistance NOT DETECTED NOT DETECTED Final   Methicillin resistance NOT DETECTED NOT DETECTED Final   Vancomycin resistance NOT DETECTED NOT DETECTED Final  Blood Culture (routine x 2)     Status: None (Preliminary result)   Collection Time: 06/14/15  8:27 PM  Result Value Ref Range Status   Specimen Description BLOOD LEFT ASSIST CONTROL  Final   Special Requests   Final    BOTTLES DRAWN AEROBIC AND ANAEROBIC AERO 5CC ANA 1CC   Culture NO GROWTH 2 DAYS  Final   Report Status PENDING  Incomplete  MRSA PCR Screening     Status: None   Collection Time: 06/15/15 12:47 AM  Result Value Ref Range Status   MRSA by PCR NEGATIVE NEGATIVE Final    Comment:        The GeneXpert MRSA Assay (FDA approved for NASAL specimens only), is one component of a comprehensive MRSA colonization surveillance program. It is not intended to diagnose MRSA infection nor to guide or monitor treatment for MRSA infections.     Thank you for allowing pharmacy to be a part of this patient's care.  Ariyan Sinnett G PharmD 06/16/2015 8:00 AM

## 2015-06-16 NOTE — Evaluation (Signed)
Physical Therapy Evaluation Patient Details Name: Tina Gilbert MRN: [id number] DOB: [birth year] Today's Date: 06/16/2015   History of Present Illness  82 yo F with UTI, sepsis and AMS.  Clinical Impression  Pt presents at her baseline level of function per discussion with family about PLOF. She was living at [facility] with 24 hour assistance and limited ambulation without AD. Pt needs 1 person hand held assistance for mobility. She presents no functional deficits or need for further skilled physical therapy.     Follow Up Recommendations No PT follow up;Supervision/Assistance - 24 hour (return to [place])    Equipment Recommendations  None recommended by PT;Other (comment) (requires hand held assistance)    Recommendations for Other Services       Precautions / Restrictions Precautions Precautions: Fall Restrictions Weight Bearing Restrictions: No      Mobility  Bed Mobility Overal bed mobility: Needs Assistance Bed Mobility: Supine to Sit     Supine to sit: Independent        Transfers Overall transfer level: Needs assistance Equipment used: 1 person hand held assist Transfers: Sit to/from [employer] Transfers Sit to Stand: Supervision Stand pivot transfers: Supervision       General transfer comment: requires assistance for safety awareness due to dementia  Ambulation/Gait Ambulation/Gait assistance: Min guard Ambulation Distance (Feet): 5 Feet Assistive device: 1 person hand held assist Gait Pattern/deviations: Shuffle     General Gait Details: No LOB  Stairs            Wheelchair Mobility    Modified Rankin (Stroke Patients Only)       Balance Overall balance assessment: Needs assistance Sitting-balance support: No upper extremity supported Sitting balance-Leahy Scale: Normal     Standing balance support: No upper extremity supported Standing balance-Leahy Scale: Good Standing balance comment: requires assistance for  safety awareness, no LOB                             Pertinent Vitals/Pain Pain Assessment: No/denies pain    Home Living Family/patient expects to be discharged to:: Assisted living               Home Equipment: None      Prior Function Level of Independence: Needs assistance   Gait / Transfers Assistance Needed: 1 person hand held assistance  ADL's / Homemaking Assistance Needed: staff assists with ADLs  Comments: Pt lives at [facility] and has assistance for all ADLs and mobility due to cognitive status     Hand Dominance        Extremity/Trunk Assessment   Upper Extremity Assessment: Difficult to assess due to impaired cognition           Lower Extremity Assessment: Overall WFL for tasks assessed         Communication      Cognition Arousal/Alertness: Awake/alert Behavior During Therapy: WFL for tasks assessed/performed Overall Cognitive Status: History of cognitive impairments - at baseline       Memory: Decreased short-term memory              General Comments      Exercises Other Exercises Other Exercises: B LE therex: ankle pumps, marching, LAQ, hip add squeezes x10 each. Required cues for technique and staying on task.      Assessment/Plan    PT Assessment Patent does not need any further PT services  PT Diagnosis Altered mental status   PT Problem  List    PT Treatment Interventions     PT Goals (Current goals can be found in the Care Plan section) Acute Rehab PT Goals PT Goal Formulation: With patient/family Time For Goal Achievement: 06/30/15 Potential to Achieve Goals: Good    Frequency     Barriers to discharge        Co-evaluation               End of Session Equipment Utilized During Treatment: Oxygen;Gait belt Activity Tolerance: Patient tolerated treatment well Patient left: in chair;with call bell/phone within reach;with chair alarm set;with family/visitor present Nurse Communication:  Mobility status;Other (comment) (need for hygiene management)         Time: [id number] PT Time Calculation (min) (ACUTE ONLY): 27 min   Charges:   PT Evaluation $PT Eval Low Complexity: 1 Procedure PT Treatments $Therapeutic Exercise: 8-22 mins   PT G Codes:        Neoma Laming, PT, DPT  06/16/2015, 12:36 PM [phone]

## 2015-06-16 NOTE — Progress Notes (Signed)
Speech Therapy Note: received order, reviewed chart notes. Consulted NSG then met w/ family and pt in room. Pt and family denied any overt s/s of aspiration or difficulty swallowing today; NSG agreed stating pt is tolerating meds and meals well. While in room, pt was eating her hamburger and drinking tea via cup (and straws use noted) w/out overt s/s of aspiration. Due to pt's baseline Cognitive decline (Dementia) w/ Parkinson's Dis., per family, general information was given on dysphagia and aspiration precautions. Family present in room did not feel pt required an evaluation of swallowing at this time stating they felt she was at her baseline currently. ST will be available for any further assessment as needed while admitted; NSG and pt/family agreed.

## 2015-06-16 NOTE — Care Management Important Message (Signed)
Important Message  Patient Details  Name: Tina Gilbert MRN: [id number] Date of Birth: [birth year]   Medicare Important Message Given:  Yes    Juliann Pulse A Fontella Shan 06/16/2015, 10:25 AM

## 2015-06-16 NOTE — Progress Notes (Signed)
SUBJECTIVE: Patient is feeling much better more alert today   Filed Vitals:   06/15/15 2225 06/15/15 2230 06/16/15 0421 06/16/15 0500  BP:   157/76   Pulse:   86   Temp:   98.9 F (37.2 C)   TempSrc:   Oral   Resp:   23   Height:      Weight:    167 lb 11.2 oz (76.068 kg)  SpO2: 93% 96% 89%     Intake/Output Summary (Last 24 hours) at 06/16/15 0902 Last data filed at 06/16/15 0400  Gross per 24 hour  Intake    980 ml  Output    400 ml  Net    580 ml    LABS: Basic Metabolic Panel:  Recent Labs  06/15/15 0500 06/16/15 0431  NA 140 138  K 3.6 3.6  CL 106 102  CO2 26 27  GLUCOSE 112* 118*  BUN 18 20  CREATININE 1.10* 1.22*  CALCIUM 7.8* 8.1*   Liver Function Tests:  Recent Labs  06/14/15 1950  AST 18  ALT 7*  ALKPHOS 53  BILITOT 0.8  PROT 6.8  ALBUMIN 3.3*   No results for input(s): LIPASE, AMYLASE in the last 72 hours. CBC:  Recent Labs  06/14/15 1950 06/15/15 0500  WBC 9.1 6.6  NEUTROABS 6.9*  --   HGB 10.6* 10.5*  HCT 31.9* 31.9*  MCV 94.6 95.1  PLT 200 149*   Cardiac Enzymes:  Recent Labs  06/14/15 1950  TROPONINI 0.05*   BNP: Invalid input(s): POCBNP D-Dimer: No results for input(s): DDIMER in the last 72 hours. Hemoglobin A1C: No results for input(s): HGBA1C in the last 72 hours. Fasting Lipid Panel: No results for input(s): CHOL, HDL, LDLCALC, TRIG, CHOLHDL, LDLDIRECT in the last 72 hours. Thyroid Function Tests: No results for input(s): TSH, T4TOTAL, T3FREE, THYROIDAB in the last 72 hours.  Invalid input(s): FREET3 Anemia Panel: No results for input(s): VITAMINB12, FOLATE, FERRITIN, TIBC, IRON, RETICCTPCT in the last 72 hours.   PHYSICAL EXAM General: Well developed, well nourished, in no acute distress HEENT:  Normocephalic and atramatic Neck:  No JVD.  Lungs: Clear bilaterally to auscultation and percussion. Heart: HRRR . Normal S1 and S2 without gallops or murmurs.  Abdomen: Bowel sounds are positive, abdomen soft  and non-tender  Msk:  Back normal, normal gait. Normal strength and tone for age. Extremities: No clubbing, cyanosis or edema.   Neuro: Alert and oriented X 3. Psych:  Good affect, responds appropriately  TELEMETRY: Patient is in atrial fibrillation with a rate of 100/m  ASSESSMENT AND PLAN: Patient has atrial fibrillation with ventricular rate about 100. No more episodes after changing Tenormin to 25 mg of metoprolol twice a day.  Active Problems:   UTI (lower urinary tract infection)    Neoma Laming A, MD, [facility] 06/16/2015 9:02 AM

## 2015-06-16 NOTE — Progress Notes (Signed)
*  PRELIMINARY RESULTS* Echocardiogram 2D Echocardiogram has been performed.  Laqueta Jean Hege 06/16/2015, 11:01 AM

## 2015-06-16 NOTE — Clinical Social Work Note (Signed)
Yesterday patient was potentially expected to discharge today however patient had some complications yesterday and may not discharge today. Shela Leff MSW,LCSW [phone]

## 2015-06-17 LAB — BASIC METABOLIC PANEL
Anion gap: 8 (ref 5–15)
BUN: 23 mg/dL — AB (ref 6–20)
CALCIUM: 7.7 mg/dL — AB (ref 8.9–10.3)
CO2: 31 mmol/L (ref 22–32)
CREATININE: 1.4 mg/dL — AB (ref 0.44–1.00)
Chloride: 95 mmol/L — ABNORMAL LOW (ref 101–111)
GFR calc Af Amer: 38 mL/min — ABNORMAL LOW (ref >60)
GFR, EST NON AFRICAN AMERICAN: 32 mL/min — AB (ref >60)
GLUCOSE: 111 mg/dL — AB (ref 65–99)
POTASSIUM: 2.9 mmol/L — AB (ref 3.5–5.1)
Sodium: 134 mmol/L — ABNORMAL LOW (ref 135–145)

## 2015-06-17 LAB — CBC
HCT: 28.5 % — ABNORMAL LOW (ref 35.0–47.0)
Hemoglobin: 9.6 g/dL — ABNORMAL LOW (ref 12.0–16.0)
MCH: 31.5 pg (ref 26.0–34.0)
MCHC: 33.6 g/dL (ref 32.0–36.0)
MCV: 93.8 fL (ref 80.0–100.0)
PLATELETS: 184 10*3/uL (ref 150–440)
RBC: 3.04 MIL/uL — ABNORMAL LOW (ref 3.80–5.20)
RDW: 15.5 % — AB (ref 11.5–14.5)
WBC: 7.6 10*3/uL (ref 3.6–11.0)

## 2015-06-17 MED ORDER — HALOPERIDOL LACTATE 5 MG/ML IJ SOLN: 2.0000 mg | Freq: Once | INTRAMUSCULAR | Status: DC

## 2015-06-17 MED ORDER — POTASSIUM CHLORIDE 10 MEQ/100ML IV SOLN
10.0000 meq | INTRAVENOUS | Status: AC
  Administered 2015-06-17 (×4): 10 meq via INTRAVENOUS
  Filled 2015-06-17 (×4): qty 100

## 2015-06-17 MED ORDER — ENOXAPARIN SODIUM 30 MG/0.3ML ~~LOC~~ SOLN
30.0000 mg | SUBCUTANEOUS | Status: DC
  Administered 2015-06-17 – 2015-06-18 (×2): 30 mg via SUBCUTANEOUS
  Filled 2015-06-17 (×2): qty 0.3

## 2015-06-17 MED ORDER — POTASSIUM CHLORIDE CRYS ER 20 MEQ PO TBCR
40.0000 meq | EXTENDED_RELEASE_TABLET | Freq: Once | ORAL | Status: AC
Start: 2015-06-17 — End: 2015-06-17
  Administered 2015-06-17: 40 meq via ORAL
  Filled 2015-06-17: qty 2

## 2015-06-17 MED ORDER — CLINDAMYCIN PHOSPHATE 600 MG/50ML IV SOLN
600.0000 mg | Freq: Three times a day (TID) | INTRAVENOUS | Status: DC
  Administered 2015-06-17 – 2015-06-19 (×5): 600 mg via INTRAVENOUS
  Filled 2015-06-17 (×8): qty 50

## 2015-06-17 MED ORDER — DEXTROSE 5 % IV SOLN
2.0000 g | INTRAVENOUS | Status: DC
  Administered 2015-06-17 – 2015-06-18 (×2): 2 g via INTRAVENOUS
  Filled 2015-06-17 (×3): qty 2

## 2015-06-17 NOTE — Progress Notes (Signed)
[place] at [place] NAME: Tina Gilbert    MR#:  [id number]  DATE OF BIRTH:  June 12, 1924  SUBJECTIVE:   Patient was agitated yesterday. Still on 3 L of oxygen     Review of Systems  Unable to perform ROS: dementia    Tolerating Diet: Yes      DRUG ALLERGIES:   Allergies  Allergen Reactions  . Avelox [Moxifloxacin Hcl In Nacl] Other (See Comments)    Reaction:  Positive ANA and bilateral iritis   . Celebrex [Celecoxib] Other (See Comments)    Reaction:  Positive ANA and bilateral iritis   . Memantine Hcl Other (See Comments)    confusion  . Tramadol Other (See Comments)    Reaction:  Hallucinations     VITALS:  Blood pressure 115/88, pulse 71, temperature 99 F (37.2 C), temperature source Oral, resp. rate 20, height 5\' 3"  (1.6 m), weight 78.2 kg (172 lb 6.4 oz), SpO2 100 %.  PHYSICAL EXAMINATION:   Physical Exam  Constitutional: She is oriented to person, not place, and time and well-developed, well-nourished, and in no distress. No distress.  HENT:  Head: Normocephalic.  Eyes: No scleral icterus.  Neck: Normal range of motion. Neck supple. No JVD present. No tracheal deviation present.  Cardiovascular: Normal rate, regular rhythm and normal heart sounds.  Exam reveals no gallop and no friction rub.   No murmur heard. Pulmonary/Chest: Effort normal. No respiratory distress. She has no wheezes. She has rales. She exhibits no tenderness.  Abdominal: Soft. Bowel sounds are normal. She exhibits no distension and no mass. There is no tenderness. There is no rebound and no guarding.  Musculoskeletal: Normal range of motion. She exhibits no edema.  Neurological: She is alert and oriented to person, place, and time.  Skin: Skin is warm. No rash noted. No erythema.  Psychiatric: Affect and judgment normal.      LABORATORY PANEL:   CBC  Recent Labs Lab 06/17/15 0045  WBC 7.6  HGB 9.6*  HCT 28.5*  PLT 184    ------------------------------------------------------------------------------------------------------------------  Chemistries   Recent Labs Lab 06/14/15 1950  06/17/15 0045  NA 134*  < > 134*  K 3.5  < > 2.9*  CL 99*  < > 95*  CO2 25  < > 31  GLUCOSE 138*  < > 111*  BUN 22*  < > 23*  CREATININE 1.17*  < > 1.40*  CALCIUM 8.2*  < > 7.7*  AST 18  --   --   ALT 7*  --   --   ALKPHOS 53  --   --   BILITOT 0.8  --   --   < > = values in this interval not displayed. ------------------------------------------------------------------------------------------------------------------  Cardiac Enzymes  Recent Labs Lab 06/14/15 1950  TROPONINI 0.05*   ------------------------------------------------------------------------------------------------------------------  RADIOLOGY:  Dg Chest 1 View  06/15/2015  CLINICAL DATA:  Tachycardia and low oxygen. EXAM: CHEST 1 VIEW COMPARISON:  June 14, 2015 FINDINGS: The heart size and mediastinal contours are stable. There is interval developed patchy consolidation of the right mid and lung base. Increased pulmonary interstitium is identified bilaterally. There areprobable minimal bilateral pleural effusions. The visualized skeletal structures are unremarkable. IMPRESSION: Interstitial edema. Patchy consolidation of the right mid to lung base suspicious for pneumonia. Probable minimal bilateral pleural effusions Electronically Signed   By: Abelardo Diesel M.D.   On: 06/15/2015 18:11     ASSESSMENT AND PLAN:   82 year old  female with a history of dementia, rectocele, pessary and essential hypertension who presents with altered mental status.  1. Acute metabolic encephalopathy: This is secondary to bacteremia. She is back to her baseline.  2. Acute cystitis without hematuria: Urine cultures positive for Escherichia coli. Continue Rocephin. Patient also has blood cultures positive for Escherichia coli.  Bacteremias due to urinary tract  infection.   3. Dementia: Continue donepezil  4. Hypothyroid: Continue Synthroid.  5. Essential hypertension: Continue atenolol.  6. Atrial fibrillation with 1-1/2 second pauses: Follow-up on echocardiogram  Continue metoprolol  No anticoagulation due to fall risk and dementia.   7. Acute hypoxic respiratory failure: X-ray shows interstitial edema with probable pneumonia.  Change to unasyn for aspiraton pna Stop lasix due worsening creatinin  Speech consult for evaluation of aspiration   Management plans discussed with the patient's daughters and they are in agreement.  CODE STATUS: FULL  TOTAL TIME TAKING CARE OF THIS PATIENT: 33 minutes.   Physical therapy consult for disposition  POSSIBLE D/C tomorrow, DEPENDING ON CLINICAL CONDITION.   Dustin Flock M.D on 06/17/2015 at 1:09 PM  Between 7am to 6pm - Pager - [phone] After 6pm go to [email or web address] - password [email or web address] [facility]  Office  [phone]  CC: Primary care physician; BABAOFF, Caryl Bis, MD  Note: This dictation was prepared with Dragon dictation along with smaller phrase technology. Any transcriptional errors that result from this process are unintentional.

## 2015-06-17 NOTE — Progress Notes (Addendum)
Pt found with bitten off o2 tubing, no s/s of choking noted.

## 2015-06-17 NOTE — Progress Notes (Signed)
Anticoagulation monitoring(Lovenox):  82yo F  ordered Lovenox 40 mg Q24h  Filed Weights   06/15/15 2018 06/16/15 0500 06/17/15 0500  Weight: 165 lb (74.844 kg) 167 lb 11.2 oz (76.068 kg) 172 lb 6.4 oz (78.2 kg)   BMI 29.3   Lab Results  Component Value Date   CREATININE 1.40* 06/17/2015   CREATININE 1.22* 06/16/2015   CREATININE 1.10* 06/15/2015   Estimated Creatinine Clearance: 27.5 mL/min (by C-G formula based on Cr of 1.4).    Hemoglobin & Hematocrit     Component Value Date/Time   HGB 9.6* 06/17/2015 0045   HCT 28.5* 06/17/2015 0045     Per Protocol for Patient with estCrcl < 30 ml/min and BMI < 40, will transition to Lovenox 30 mg Q24h     Chinita Greenland PharmD Clinical Pharmacist 06/17/2015 1:34 PM

## 2015-06-17 NOTE — Progress Notes (Signed)
SUBJECTIVE: Patient is restless and pulls out her monitor but appears to be comfortable.   Filed Vitals:   06/16/15 2207 06/16/15 2339 06/17/15 0436 06/17/15 0500  BP: 134/52 126/64 115/88   Pulse:  66 71   Temp:  99.7 F (37.6 C) 99 F (37.2 C)   TempSrc:  Oral Oral   Resp:  17 20   Height:      Weight:    172 lb 6.4 oz (78.2 kg)  SpO2:  100% 100%     Intake/Output Summary (Last 24 hours) at 06/17/15 1334 Last data filed at 06/17/15 1108  Gross per 24 hour  Intake    720 ml  Output    800 ml  Net    -80 ml    LABS: Basic Metabolic Panel:  Recent Labs  06/16/15 0431 06/17/15 0045  NA 138 134*  K 3.6 2.9*  CL 102 95*  CO2 27 31  GLUCOSE 118* 111*  BUN 20 23*  CREATININE 1.22* 1.40*  CALCIUM 8.1* 7.7*   Liver Function Tests:  Recent Labs  06/14/15 1950  AST 18  ALT 7*  ALKPHOS 53  BILITOT 0.8  PROT 6.8  ALBUMIN 3.3*   No results for input(s): LIPASE, AMYLASE in the last 72 hours. CBC:  Recent Labs  06/14/15 1950 06/15/15 0500 06/17/15 0045  WBC 9.1 6.6 7.6  NEUTROABS 6.9*  --   --   HGB 10.6* 10.5* 9.6*  HCT 31.9* 31.9* 28.5*  MCV 94.6 95.1 93.8  PLT 200 149* 184   Cardiac Enzymes:  Recent Labs  06/14/15 1950  TROPONINI 0.05*   BNP: Invalid input(s): POCBNP D-Dimer: No results for input(s): DDIMER in the last 72 hours. Hemoglobin A1C: No results for input(s): HGBA1C in the last 72 hours. Fasting Lipid Panel: No results for input(s): CHOL, HDL, LDLCALC, TRIG, CHOLHDL, LDLDIRECT in the last 72 hours. Thyroid Function Tests: No results for input(s): TSH, T4TOTAL, T3FREE, THYROIDAB in the last 72 hours.  Invalid input(s): FREET3 Anemia Panel: No results for input(s): VITAMINB12, FOLATE, FERRITIN, TIBC, IRON, RETICCTPCT in the last 72 hours.   PHYSICAL EXAM General: Well developed, well nourished, in no acute distress HEENT:  Normocephalic and atramatic Neck:  No JVD.  Lungs: Clear bilaterally to auscultation and  percussion. Heart: HRRR . Normal S1 and S2 without gallops or murmurs.  Abdomen: Bowel sounds are positive, abdomen soft and non-tender  Msk:  Back normal, normal gait. Normal strength and tone for age. Extremities: No clubbing, cyanosis or edema.   Neuro: Alert and oriented X 3. Psych:  Good affect, responds appropriately  TELEMETRY: Sinus rhythm  ASSESSMENT AND PLAN: Bradycardia tachycardia syndrome. Patient is maintaining heart rate in the 70s with metoprolol 25 twice a day. Echocardiogram did have wall motion abnormality suggestive of coronary artery disease with normal EF. However will treat the patient conservatively.  Active Problems:   UTI (lower urinary tract infection)    Dionisio David, MD, [facility] 06/17/2015 1:34 PM

## 2015-06-17 NOTE — Progress Notes (Signed)
Dr Marcille Blanco notified of slow run time for Potassium boluses, and also increased confusion, orders given

## 2015-06-18 ENCOUNTER — Inpatient Hospital Stay: Payer: Medicare Other

## 2015-06-18 LAB — BASIC METABOLIC PANEL
Anion gap: 10 (ref 5–15)
BUN: 21 mg/dL — AB (ref 6–20)
CALCIUM: 8 mg/dL — AB (ref 8.9–10.3)
CO2: 29 mmol/L (ref 22–32)
CREATININE: 1.04 mg/dL — AB (ref 0.44–1.00)
Chloride: 95 mmol/L — ABNORMAL LOW (ref 101–111)
GFR calc Af Amer: 54 mL/min — ABNORMAL LOW (ref >60)
GFR calc non Af Amer: 47 mL/min — ABNORMAL LOW (ref >60)
GLUCOSE: 157 mg/dL — AB (ref 65–99)
Potassium: 3.5 mmol/L (ref 3.5–5.1)
Sodium: 134 mmol/L — ABNORMAL LOW (ref 135–145)

## 2015-06-18 NOTE — Progress Notes (Signed)
[place] at [place] NAME: Tina Gilbert    MR#:  [id number]  DATE OF BIRTH:  Jan 24, 1925  SUBJECTIVE:   Patient unable to provide any history due to her dementia but sitting of the bed side     Review of Systems  Unable to perform ROS: dementia    Tolerating Diet: Yes      DRUG ALLERGIES:   Allergies  Allergen Reactions  . Avelox [Moxifloxacin Hcl In Nacl] Other (See Comments)    Reaction:  Positive ANA and bilateral iritis   . Celebrex [Celecoxib] Other (See Comments)    Reaction:  Positive ANA and bilateral iritis   . Memantine Hcl Other (See Comments)    confusion  . Tramadol Other (See Comments)    Reaction:  Hallucinations     VITALS:  Blood pressure 136/44, pulse 75, temperature 98.4 F (36.9 C), temperature source Oral, resp. rate 18, height 5\' 3"  (1.6 m), weight 74.254 kg (163 lb 11.2 oz), SpO2 98 %.  PHYSICAL EXAMINATION:   Physical Exam  Constitutional: She is oriented to person, not place, and time and well-developed, well-nourished, and in no distress. No distress.  HENT:  Head: Normocephalic.  Eyes: No scleral icterus.  Neck: Normal range of motion. Neck supple. No JVD present. No tracheal deviation present.  Cardiovascular: Normal rate, regular rhythm and normal heart sounds.  Exam reveals no gallop and no friction rub.   No murmur heard. Pulmonary/Chest: Effort normal. No respiratory distress. She has no wheezes. She has rales. She exhibits no tenderness.  Abdominal: Soft. Bowel sounds are normal. She exhibits no distension and no mass. There is no tenderness. There is no rebound and no guarding.  Musculoskeletal: Normal range of motion. She exhibits no edema.  Neurological: She is alert and oriented to person, place, and time.  Skin: Skin is warm. No rash noted. No erythema.  Psychiatric: Affect and judgment normal.      LABORATORY PANEL:   CBC  Recent Labs Lab 06/17/15 0045  WBC 7.6   HGB 9.6*  HCT 28.5*  PLT 184   ------------------------------------------------------------------------------------------------------------------  Chemistries   Recent Labs Lab 06/14/15 1950  06/17/15 0045  NA 134*  < > 134*  K 3.5  < > 2.9*  CL 99*  < > 95*  CO2 25  < > 31  GLUCOSE 138*  < > 111*  BUN 22*  < > 23*  CREATININE 1.17*  < > 1.40*  CALCIUM 8.2*  < > 7.7*  AST 18  --   --   ALT 7*  --   --   ALKPHOS 53  --   --   BILITOT 0.8  --   --   < > = values in this interval not displayed. ------------------------------------------------------------------------------------------------------------------  Cardiac Enzymes  Recent Labs Lab 06/14/15 1950  TROPONINI 0.05*   ------------------------------------------------------------------------------------------------------------------  RADIOLOGY:  No results found.   ASSESSMENT AND PLAN:   82 year old female with a history of dementia, rectocele, pessary and essential hypertension who presents with altered mental status.  1. Acute metabolic encephalopathy: This is secondary to bacteremia. She is back to her baseline.  2. Acute cystitis without hematuria: Urine cultures positive for Escherichia coli. Continue Rocephin. Patient also has blood cultures positive for Escherichia coli. Will need total 14 days antibiotics Also for suspected pneumonia due to aspiration continue clindamycin   3. Dementia: Continue donepezil  4. Hypothyroid: Continue Synthroid.  5. Essential hypertension: Continue atenolol.  6.  Atrial fibrillation with 1-1/2 second pauses: Appreciate cardiology input  Continue metoprolol  No anticoagulation due to fall risk and dementia.   7. Acute hypoxic respiratory failure: X-ray shows interstitial edema with probable pneumonia.  To further evaluate her lungs more better we will get a CT scan of the chest without contrast   CODE STATUS: FULL  TOTAL TIME TAKING CARE OF THIS PATIENT: 33  minutes.   Physical therapy consult for disposition  POSSIBLE D/C tomorrow, DEPENDING ON CLINICAL CONDITION.   Dustin Flock M.D on 06/18/2015 at 10:48 AM  Between 7am to 6pm - Pager - [phone] After 6pm go to [email or web address] - password [email or web address] [facility]  Office  [phone]  CC: Primary care physician; BABAOFF, Caryl Bis, MD  Note: This dictation was prepared with Dragon dictation along with smaller phrase technology. Any transcriptional errors that result from this process are unintentional.

## 2015-06-18 NOTE — Progress Notes (Signed)
SUBJECTIVE: Patient feeling much better   Filed Vitals:   06/17/15 1349 06/17/15 2015 06/18/15 0516 06/18/15 0531  BP: 102/54 114/50 136/44   Pulse: 73 75 75   Temp: 98.6 F (37 C) 99.3 F (37.4 C) 98.4 F (36.9 C)   TempSrc: Oral Oral Oral   Resp: 24 17 18    Height:      Weight:    163 lb 11.2 oz (74.254 kg)  SpO2: 94% 99% 98%     Intake/Output Summary (Last 24 hours) at 06/18/15 1029 Last data filed at 06/18/15 0900  Gross per 24 hour  Intake    405 ml  Output    450 ml  Net    -45 ml    LABS: Basic Metabolic Panel:  Recent Labs  06/16/15 0431 06/17/15 0045  NA 138 134*  K 3.6 2.9*  CL 102 95*  CO2 27 31  GLUCOSE 118* 111*  BUN 20 23*  CREATININE 1.22* 1.40*  CALCIUM 8.1* 7.7*   Liver Function Tests: No results for input(s): AST, ALT, ALKPHOS, BILITOT, PROT, ALBUMIN in the last 72 hours. No results for input(s): LIPASE, AMYLASE in the last 72 hours. CBC:  Recent Labs  06/17/15 0045  WBC 7.6  HGB 9.6*  HCT 28.5*  MCV 93.8  PLT 184   Cardiac Enzymes: No results for input(s): CKTOTAL, CKMB, CKMBINDEX, TROPONINI in the last 72 hours. BNP: Invalid input(s): POCBNP D-Dimer: No results for input(s): DDIMER in the last 72 hours. Hemoglobin A1C: No results for input(s): HGBA1C in the last 72 hours. Fasting Lipid Panel: No results for input(s): CHOL, HDL, LDLCALC, TRIG, CHOLHDL, LDLDIRECT in the last 72 hours. Thyroid Function Tests: No results for input(s): TSH, T4TOTAL, T3FREE, THYROIDAB in the last 72 hours.  Invalid input(s): FREET3 Anemia Panel: No results for input(s): VITAMINB12, FOLATE, FERRITIN, TIBC, IRON, RETICCTPCT in the last 72 hours.   PHYSICAL EXAM General: Well developed, well nourished, in no acute distress HEENT:  Normocephalic and atramatic Neck:  No JVD.  Lungs: Clear bilaterally to auscultation and percussion. Heart: HRRR . Normal S1 and S2 without gallops or murmurs.  Abdomen: Bowel sounds are positive, abdomen soft and  non-tender  Msk:  Back normal, normal gait. Normal strength and tone for age. Extremities: No clubbing, cyanosis or edema.   Neuro: Alert and oriented X 3. Psych:  Good affect, responds appropriately  TELEMETRY: Sinus rhythm 78 bpm  ASSESSMENT AND PLAN: Paroxysmal atrial fibrillation but now in sinus rhythm and urosepsis. Advise continuation of metoprolol 25 twice a day.  Active Problems:   UTI (lower urinary tract infection)    Tina David, MD, [facility] 06/18/2015 10:29 AM

## 2015-06-19 ENCOUNTER — Encounter: Payer: Self-pay | Admitting: Obstetrics and Gynecology

## 2015-06-19 LAB — BASIC METABOLIC PANEL
Anion gap: 5 (ref 5–15)
BUN: 20 mg/dL (ref 6–20)
CHLORIDE: 98 mmol/L — AB (ref 101–111)
CO2: 34 mmol/L — AB (ref 22–32)
CREATININE: 0.89 mg/dL (ref 0.44–1.00)
Calcium: 8.4 mg/dL — ABNORMAL LOW (ref 8.9–10.3)
GFR calc non Af Amer: 56 mL/min — ABNORMAL LOW (ref >60)
Glucose, Bld: 94 mg/dL (ref 65–99)
Potassium: 3.5 mmol/L (ref 3.5–5.1)
Sodium: 137 mmol/L (ref 135–145)

## 2015-06-19 LAB — CULTURE, BLOOD (ROUTINE X 2): Culture: NO GROWTH

## 2015-06-19 MED ORDER — POLYETHYLENE GLYCOL 3350 17 G PO PACK: 17.0000 g | PACK | Freq: Every day | ORAL | Status: DC | PRN

## 2015-06-19 MED ORDER — METOPROLOL TARTRATE 25 MG PO TABS: 25.0000 mg | ORAL_TABLET | Freq: Two times a day (BID) | ORAL | Status: DC

## 2015-06-19 MED ORDER — ENOXAPARIN SODIUM 40 MG/0.4ML ~~LOC~~ SOLN: 40.0000 mg | SUBCUTANEOUS | Status: DC

## 2015-06-19 MED ORDER — CEFUROXIME AXETIL 500 MG PO TABS: 500.0000 mg | ORAL_TABLET | Freq: Two times a day (BID) | ORAL | Status: DC

## 2015-06-19 NOTE — Progress Notes (Signed)
SUBJECTIVE: Feeling much better   Filed Vitals:   06/18/15 1835 06/18/15 2042 06/19/15 0500 06/19/15 0549  BP:  110/71  131/48  Pulse:  82  89  Temp:  98.3 F (36.8 C)  98.8 F (37.1 C)  TempSrc:  Oral  Oral  Resp:  16  20  Height:      Weight:   162 lb (73.483 kg)   SpO2: 99% 97%  93%    Intake/Output Summary (Last 24 hours) at 06/19/15 0855 Last data filed at 06/19/15 0549  Gross per 24 hour  Intake    720 ml  Output    300 ml  Net    420 ml    LABS: Basic Metabolic Panel:  Recent Labs  06/18/15 1120 06/19/15 0547  NA 134* 137  K 3.5 3.5  CL 95* 98*  CO2 29 34*  GLUCOSE 157* 94  BUN 21* 20  CREATININE 1.04* 0.89  CALCIUM 8.0* 8.4*   Liver Function Tests: No results for input(s): AST, ALT, ALKPHOS, BILITOT, PROT, ALBUMIN in the last 72 hours. No results for input(s): LIPASE, AMYLASE in the last 72 hours. CBC:  Recent Labs  06/17/15 0045  WBC 7.6  HGB 9.6*  HCT 28.5*  MCV 93.8  PLT 184   Cardiac Enzymes: No results for input(s): CKTOTAL, CKMB, CKMBINDEX, TROPONINI in the last 72 hours. BNP: Invalid input(s): POCBNP D-Dimer: No results for input(s): DDIMER in the last 72 hours. Hemoglobin A1C: No results for input(s): HGBA1C in the last 72 hours. Fasting Lipid Panel: No results for input(s): CHOL, HDL, LDLCALC, TRIG, CHOLHDL, LDLDIRECT in the last 72 hours. Thyroid Function Tests: No results for input(s): TSH, T4TOTAL, T3FREE, THYROIDAB in the last 72 hours.  Invalid input(s): FREET3 Anemia Panel: No results for input(s): VITAMINB12, FOLATE, FERRITIN, TIBC, IRON, RETICCTPCT in the last 72 hours.   PHYSICAL EXAM General: Well developed, well nourished, in no acute distress HEENT:  Normocephalic and atramatic Neck:  No JVD.  Lungs: Clear bilaterally to auscultation and percussion. Heart: HRRR . Normal S1 and S2 without gallops or murmurs.  Abdomen: Bowel sounds are positive, abdomen soft and non-tender  Msk:  Back normal, normal gait.  Normal strength and tone for age. Extremities: No clubbing, cyanosis or edema.   Neuro: Alert and oriented X 3. Psych:  Good affect, responds appropriately  TELEMETRY: Sinus rhythm 80 bpm  ASSESSMENT AND PLAN: Atrial fibrillation has resolved no longer having pauses continue metoprolol 25 by mouth twice a day.  Active Problems:   UTI (lower urinary tract infection)    Dionisio David, MD, [facility] 06/19/2015 8:55 AM

## 2015-06-19 NOTE — Discharge Instructions (Signed)
°  DIET:  Cardiac diet  DISCHARGE CONDITION:  Stable  ACTIVITY:  Activity as tolerated  OXYGEN:  Home Oxygen: No.   Oxygen Delivery: room air  DISCHARGE LOCATION:  assited living    ADDITIONAL DISCHARGE INSTRUCTION:   If you experience worsening of your admission symptoms, develop shortness of breath, life threatening emergency, suicidal or homicidal thoughts you must seek medical attention immediately by calling 911 or calling your MD immediately  if symptoms less severe.  You Must read complete instructions/literature along with all the possible adverse reactions/side effects for all the Medicines you take and that have been prescribed to you. Take any new Medicines after you have completely understood and accpet all the possible adverse reactions/side effects.   Please note  You were cared for by a hospitalist during your hospital stay. If you have any questions about your discharge medications or the care you received while you were in the hospital after you are discharged, you can call the unit and asked to speak with the hospitalist on call if the hospitalist that took care of you is not available. Once you are discharged, your primary care physician will handle any further medical issues. Please note that NO REFILLS for any discharge medications will be authorized once you are discharged, as it is imperative that you return to your primary care physician (or establish a relationship with a primary care physician if you do not have one) for your aftercare needs so that they can reassess your need for medications and monitor your lab values.

## 2015-06-19 NOTE — Progress Notes (Signed)
Enoxaparin   Patient qualifies for Enoxaparin 40 mg SQ daily based on CrCl >30 ml/min per policy. Will change to Enoxaparin 40 mg SQ daily.  Elizabeth Paulsen D. Clemencia Helzer, PharmD   

## 2015-06-19 NOTE — Discharge Summary (Addendum)
Tina Gilbert, 82 y.o., DOB 30-Sep-1924, MRN [id number]. Admission date: 06/14/2015 Discharge Date 06/19/2015 Primary MD BABAOFF, Caryl Bis, MD Admitting Physician Tina Bow, MD  Admission Diagnosis  UTI (lower urinary tract infection) [N39.0] Sepsis, due to unspecified organism [facility]) [A41.9]  Discharge Diagnosis   Active Problems:   UTI (lower urinary tract infection)  Acute metabolic encephalopathy Sepsis due to Escherichia coli source urinary tract Acute respiratory failure suspect due to CHF diastolic in nature Dementia Hypothyroidism Essential hypertension Atrial fibrillation with pauses     Hospital course  [name] is a 82 y.o. female with a known history of dementia, hypertension resident of assisted living facility presents to the emergency room due to fever and confusion. She has been found to have a urinary tract infection. Patient has rectocele and cystocele and wears a pessary. Follows with gynecology closely. Patient was admitted for acute encephalopathy. And started on antibiotics. Her urine culture did show Escherichia coli as well as her blood culture. Patient was treated with appropriate antibiotics with resolution of her symptoms. She was in the process of being discharged when she started complaining of shortness of breath and required oxygenation. She had a chest x-ray which suggested possible pneumonia. We will congestive heart failure. She was given Lasix. She also had a CT scan of the chest subsequently which showed resolution of the infiltrate likely related to CHF. She was seen by speech and thought to haves no wallowing difficulties.Patient also has atrial atrial fibrillation and had has pauses during hospitalization her metoprolol dose was decreased. With resolution of her pauses. At this point she is back to baseline and is stable to discharge.              Consults  cardiology  Significant Tests:  See full reports for all details    Dg  Chest 1 View  06/15/2015  CLINICAL DATA:  Tachycardia and low oxygen. EXAM: CHEST 1 VIEW COMPARISON:  June 14, 2015 FINDINGS: The heart size and mediastinal contours are stable. There is interval developed patchy consolidation of the right mid and lung base. Increased pulmonary interstitium is identified bilaterally. There areprobable minimal bilateral pleural effusions. The visualized skeletal structures are unremarkable. IMPRESSION: Interstitial edema. Patchy consolidation of the right mid to lung base suspicious for pneumonia. Probable minimal bilateral pleural effusions Electronically Signed   By: Abelardo Diesel M.D.   On: 06/15/2015 18:11   Ct Chest Wo Contrast  06/18/2015  CLINICAL DATA:  82 year old presenting with acute hypoxic respiratory failure. Current history of dementia. Abnormal chest x-ray 06/15/2015. EXAM: CT CHEST WITHOUT CONTRAST TECHNIQUE: Multidetector CT imaging of the chest was performed following the standard protocol without IV contrast. COMPARISON:  No prior CT.  Chest x-rays 06/15/2015 and earlier. FINDINGS: Cardiovascular: Heart mildly enlarged. Mild to moderate 3 vessel coronary atherosclerosis. No pericardial effusion. Moderate to severe atherosclerosis involving the thoracic and upper abdominal aorta without evidence of aneurysm. Mediastinum/Lymph Nodes: No pathologically enlarged mediastinal, hilar or axillary lymph nodes. No mediastinal masses. Normal-appearing esophagus. Thyroid gland atrophic. Lungs/Pleura: Interval near complete resolution of the interstitial and airspace pulmonary edema identified on the chest x-ray 3 days ago, with only minimal interstitial edema persisting. Improved bilateral pleural effusions, with only small effusions persisting. Associated passive atelectasis in the lower lobes. No confluent airspace consolidation currently to suggest pneumonia. Central airways patent without significant bronchial wall thickening. Upper abdomen: Calcified granuloma in  the right lobe of the liver No acute abnormalities. Musculoskeletal: Osseous demineralization. Mild diffuse degenerative disc disease and  spondylosis throughout the thoracic spine. No acute abnormalities. IMPRESSION: 1. Near complete resolution of CHF since the chest x-ray 3 days ago, with only minimal interstitial edema persisting. 2. Marked improvement in bilateral pleural effusions, with only small effusions persisting. 3. Associated mild passive atelectasis in the lower lobes, left greater than right. No evidence of pneumonia. 4. Mild cardiomegaly with mild to moderate 3 vessel coronary atherosclerosis. Electronically Signed   By: Evangeline Dakin M.D.   On: 06/18/2015 14:28   Dg Chest Port 1 View  06/14/2015  CLINICAL DATA:  Fever, altered mental status. EXAM: PORTABLE CHEST 1 VIEW COMPARISON:  April 02, 2015. FINDINGS: The heart size and mediastinal contours are within normal limits. Both lungs are clear. No pneumothorax or pleural effusion is noted. The visualized skeletal structures are unremarkable. IMPRESSION: No acute cardiopulmonary abnormality seen. Electronically Signed   By: Marijo Conception, M.D.   On: 06/14/2015 20:56       Today   Subjective:   Tina Gilbert  feels okay denies any complaints off oxygen  Objective:   Blood pressure 132/60, pulse 89, temperature 98.8 F (37.1 C), temperature source Oral, resp. rate 20, height 5\' 3"  (1.6 m), weight 73.483 kg (162 lb), SpO2 93 %.  .  Intake/Output Summary (Last 24 hours) at 06/19/15 1016 Last data filed at 06/19/15 0549  Gross per 24 hour  Intake    720 ml  Output    300 ml  Net    420 ml    Exam VITAL SIGNS: Blood pressure 132/60, pulse 89, temperature 98.8 F (37.1 C), temperature source Oral, resp. rate 20, height 5\' 3"  (1.6 m), weight 73.483 kg (162 lb), SpO2 93 %.  GENERAL:  82 y.o.-year-old patient lying in the bed with no acute distress.  EYES: Pupils equal, round, reactive to light and accommodation. No scleral  icterus. Extraocular muscles intact.  HEENT: Head atraumatic, normocephalic. Oropharynx and nasopharynx clear.  NECK:  Supple, no jugular venous distention. No thyroid enlargement, no tenderness.  LUNGS: Normal breath sounds bilaterally, no wheezing, rales,rhonchi or crepitation. No use of accessory muscles of respiration.  CARDIOVASCULAR: S1, S2 normal. No murmurs, rubs, or gallops.  ABDOMEN: Soft, nontender, nondistended. Bowel sounds present. No organomegaly or mass.  EXTREMITIES: No pedal edema, cyanosis, or clubbing.  NEUROLOGIC: Cranial nerves II through XII are intact. Muscle strength 5/5 in all extremities. Sensation intact. Gait not checked.  PSYCHIATRIC: The patient is alert and oriented x 3.  SKIN: No obvious rash, lesion, or ulcer.   Data Review     CBC w Diff: Lab Results  Component Value Date   WBC 7.6 06/17/2015   HGB 9.6* 06/17/2015   HCT 28.5* 06/17/2015   PLT 184 06/17/2015   LYMPHOPCT 14 06/14/2015   MONOPCT 10 06/14/2015   EOSPCT 0 06/14/2015   BASOPCT 1 06/14/2015   CMP: Lab Results  Component Value Date   NA 137 06/19/2015   K 3.5 06/19/2015   CL 98* 06/19/2015   CO2 34* 06/19/2015   BUN 20 06/19/2015   CREATININE 0.89 06/19/2015   CREATININE 1.09 11/26/2012   PROT 6.8 06/14/2015   ALBUMIN 3.3* 06/14/2015   BILITOT 0.8 06/14/2015   ALKPHOS 53 06/14/2015   AST 18 06/14/2015   ALT 7* 06/14/2015  .  Micro Results Recent Results (from the past 240 hour(s))  Urine culture     Status: None   Collection Time: 06/14/15  7:50 PM  Result Value Ref Range Status   Specimen Description  URINE, RANDOM  Final   Special Requests NONE  Final   Culture >=100,000 COLONIES/mL ESCHERICHIA COLI  Final   Report Status 06/16/2015 FINAL  Final   Organism ID, Bacteria ESCHERICHIA COLI  Final      Susceptibility   Escherichia coli - MIC*    AMPICILLIN >=32 RESISTANT Resistant     CEFAZOLIN <=4 SENSITIVE Sensitive     CEFTRIAXONE <=1 SENSITIVE Sensitive      CIPROFLOXACIN <=0.25 SENSITIVE Sensitive     GENTAMICIN <=1 SENSITIVE Sensitive     IMIPENEM <=0.25 SENSITIVE Sensitive     NITROFURANTOIN <=16 SENSITIVE Sensitive     TRIMETH/SULFA >=320 RESISTANT Resistant     AMPICILLIN/SULBACTAM >=32 RESISTANT Resistant     PIP/TAZO 32 INTERMEDIATE Intermediate     Extended ESBL NEGATIVE Sensitive     * >=100,000 COLONIES/mL ESCHERICHIA COLI  Blood Culture (routine x 2)     Status: None (Preliminary result)   Collection Time: 06/14/15  7:52 PM  Result Value Ref Range Status   Specimen Description BLOOD RIGHT ASSIST CONTROL  Final   Special Requests BOTTLES DRAWN AEROBIC AND ANAEROBIC  5CC  Final   Culture  Setup Time   Final    GRAM NEGATIVE RODS AEROBIC BOTTLE ONLY CRITICAL RESULT CALLED TO, READ BACK BY AND VERIFIED WITH: KAREN HAYES 06/15/15 1207 MLM    Culture ESCHERICHIA COLI AEROBIC BOTTLE ONLY   Final   Report Status PENDING  Incomplete   Organism ID, Bacteria ESCHERICHIA COLI  Final      Susceptibility   Escherichia coli - MIC*    AMPICILLIN >=32 RESISTANT Resistant     CEFAZOLIN <=4 SENSITIVE Sensitive     CEFEPIME <=1 SENSITIVE Sensitive     CEFTAZIDIME <=1 SENSITIVE Sensitive     CEFTRIAXONE <=1 SENSITIVE Sensitive     CIPROFLOXACIN <=0.25 SENSITIVE Sensitive     GENTAMICIN <=1 SENSITIVE Sensitive     IMIPENEM <=0.25 SENSITIVE Sensitive     TRIMETH/SULFA >=320 RESISTANT Resistant     AMPICILLIN/SULBACTAM >=32 RESISTANT Resistant     PIP/TAZO <=4 SENSITIVE Sensitive     Extended ESBL NEGATIVE Sensitive     * ESCHERICHIA COLI  Blood Culture ID Panel (Reflexed)     Status: Abnormal   Collection Time: 06/14/15  7:52 PM  Result Value Ref Range Status   Enterococcus species NOT DETECTED NOT DETECTED Final   Listeria monocytogenes NOT DETECTED NOT DETECTED Final   Staphylococcus species NOT DETECTED NOT DETECTED Final   Staphylococcus aureus NOT DETECTED NOT DETECTED Final   Streptococcus species NOT DETECTED NOT DETECTED Final    Streptococcus agalactiae NOT DETECTED NOT DETECTED Final   Streptococcus pneumoniae NOT DETECTED NOT DETECTED Final   Streptococcus pyogenes NOT DETECTED NOT DETECTED Final   Acinetobacter baumannii NOT DETECTED NOT DETECTED Final   Enterobacteriaceae species DETECTED (A) NOT DETECTED Final    Comment: CRITICAL RESULT CALLED TO, READ BACK BY AND VERIFIED WITH: KAREN HAYES 06/15/15 1207 MLM    Enterobacter cloacae complex NOT DETECTED NOT DETECTED Final   Escherichia coli DETECTED (A) NOT DETECTED Final    Comment: CRITICAL RESULT CALLED TO, READ BACK BY AND VERIFIED WITH: KAREN HAYES 06/15/15 1207 MLM    Klebsiella oxytoca NOT DETECTED NOT DETECTED Final   Klebsiella pneumoniae NOT DETECTED NOT DETECTED Final   Proteus species NOT DETECTED NOT DETECTED Final   Serratia marcescens NOT DETECTED NOT DETECTED Final   Haemophilus influenzae NOT DETECTED NOT DETECTED Final   Neisseria meningitidis NOT DETECTED NOT  DETECTED Final   Pseudomonas aeruginosa NOT DETECTED NOT DETECTED Final   Candida albicans NOT DETECTED NOT DETECTED Final   Candida glabrata NOT DETECTED NOT DETECTED Final   Candida krusei NOT DETECTED NOT DETECTED Final   Candida parapsilosis NOT DETECTED NOT DETECTED Final   Candida tropicalis NOT DETECTED NOT DETECTED Final   Carbapenem resistance NOT DETECTED NOT DETECTED Final   Methicillin resistance NOT DETECTED NOT DETECTED Final   Vancomycin resistance NOT DETECTED NOT DETECTED Final  Blood Culture (routine x 2)     Status: None   Collection Time: 06/14/15  8:27 PM  Result Value Ref Range Status   Specimen Description BLOOD LEFT ASSIST CONTROL  Final   Special Requests   Final    BOTTLES DRAWN AEROBIC AND ANAEROBIC AERO 5CC ANA [place]   Culture NO GROWTH 5 DAYS  Final   Report Status 06/19/2015 FINAL  Final  MRSA PCR Screening     Status: None   Collection Time: 06/15/15 12:47 AM  Result Value Ref Range Status   MRSA by PCR NEGATIVE NEGATIVE Final    Comment:         The GeneXpert MRSA Assay (FDA approved for NASAL specimens only), is one component of a comprehensive MRSA colonization surveillance program. It is not intended to diagnose MRSA infection nor to guide or monitor treatment for MRSA infections.   Culture, blood (Routine X 2) w Reflex to ID Panel     Status: None (Preliminary result)   Collection Time: 06/17/15 12:45 AM  Result Value Ref Range Status   Specimen Description BLOOD LEFT ANTECUBITAL  Final   Special Requests BOTTLES DRAWN AEROBIC AND ANAEROBIC 10CC  Final   Culture NO GROWTH 2 DAYS  Final   Report Status PENDING  Incomplete  Culture, blood (Routine X 2) w Reflex to ID Panel     Status: None (Preliminary result)   Collection Time: 06/17/15 12:45 AM  Result Value Ref Range Status   Specimen Description BLOOD RIGHT ANTECUBITAL  Final   Special Requests BOTTLES DRAWN AEROBIC AND ANAEROBIC 10CC  Final   Culture NO GROWTH 2 DAYS  Final   Report Status PENDING  Incomplete        Code Status Orders        Start     Ordered   06/14/15 2322  Full code   Continuous     06/14/15 2323    Code Status History    Date Active Date Inactive Code Status Order ID Comments User Context   This patient has a current code status but no historical code status.    Advance Directive Documentation        Most Recent Value   Type of Advance Directive  Living will   Pre-existing out of facility DNR order (yellow form or pink MOST form)     "MOST" Form in Place?              Discharge Medications     Medication List    ASK your doctor about these medications        acetaminophen 325 MG tablet  Commonly known as:  TYLENOL  Take 650 mg by mouth every 6 (six) hours as needed.     ALPRAZolam 0.25 MG tablet  Commonly known as:  XANAX  Take 0.25 mg by mouth at bedtime as needed.     aspirin 81 MG chewable tablet  Chew 81 mg by mouth daily.     Metoprolol 25mg  po  bid  Commonly known as:  metoprolol  Take 50 mg by  mouth daily.     BENEFIBER DRINK MIX PO  Take 1 Dose by mouth daily. Reported on 06/14/2015     carbidopa-levodopa 50-200 MG tablet  Commonly known as:  SINEMET CR  Take 1 tablet by mouth 2 (two) times daily.     citalopram 20 MG tablet  Commonly known as:  CELEXA  Take 20 mg by mouth daily. Reported on 06/14/2015     conjugated estrogens vaginal cream  Commonly known as:  PREMARIN  Place 1 Applicatorful vaginally 2 (two) times a week.     donepezil 5 MG tablet  Commonly known as:  ARICEPT  Reported on 06/14/2015     levothyroxine 137 MCG tablet  Commonly known as:  SYNTHROID, LEVOTHROID  Take 137 mcg by mouth daily before breakfast. Reported on 06/14/2015     omeprazole 20 MG capsule  Commonly known as:  PRILOSEC  Take 20 mg by mouth daily. Reported on 06/14/2015     oxyCODONE-acetaminophen 5-325 MG tablet  Commonly known as:  ROXICET  Take 1 tablet by mouth every 6 (six) hours as needed for severe pain.     solifenacin 5 MG tablet  Commonly known as:  VESICARE  Take 5 mg by mouth daily. Reported on 06/14/2015     triamterene-hydrochlorothiazide 37.5-25 MG tablet  Commonly known as:  MAXZIDE-25  Take 0.5 tablets by mouth daily. Reported on 06/14/2015     TRIMO-SAN 0.025 % Gel  Generic drug:  OXYQUINOLONE SULFATE VAGINAL  USE AS DIRECTED     Vitamin D 2000 units tablet  Take 2,000 Units by mouth daily. Reported on 06/14/2015           Total Time in preparing paper work, data evaluation and todays exam - 35 minutes  Dustin Flock M.D on 06/19/2015 at 10:16 AM  [facility]   Office  [phone]

## 2015-06-19 NOTE — Care Management Important Message (Signed)
Important Message  Patient Details  Name: Tina Gilbert MRN: [id number] Date of Birth: [birth year]   Medicare Important Message Given:  Yes    [name] 06/19/2015, 10:48 AM

## 2015-06-19 NOTE — Progress Notes (Signed)
Spoke with nurse at [facility] and gave report. Notified pt POA who notified pt son that pt is ready for discharge. IV site removed. Concerns addressed. Awaiting transportation via pt son.  Pt son arrived to pick up patient. Discharge packet given to pt.

## 2015-06-19 NOTE — NC FL2 (Signed)
[place] LEVEL OF CARE SCREENING TOOL     IDENTIFICATION  Patient Name: Tina Gilbert Birthdate: August 07, 1924 Sex: female Admission Date (Current Location): 06/14/2015  [place] and [place] Number:  Engineering geologist and Address:  [facility], [address] [place], [place] [address]      Provider Number: [id number]  Attending Physician Name and Address:  Dustin Flock, MD  Relative Name and Phone Number:       Current Level of Care: Hospital Recommended Level of Care: [place] Prior Approval Number:    Date Approved/Denied:   PASRR Number:    Discharge Plan:  (ALF)    Current Diagnoses: Patient Active Problem List   Diagnosis Date Noted  . UTI (lower urinary tract infection) 06/14/2015  . Cystocele 12/02/2014  . Rectocele 12/02/2014  . Abnormal brain scan 09/10/2013  . Difficulty in walking 09/10/2013  . Amnesia 09/10/2013  . Idiopathic Parkinson's disease ([place]) 09/10/2013  . Parkinson's disease ([place]) 09/10/2013  . Acid reflux 09/03/2013  . Benign essential HTN 09/03/2013  . HLD (hyperlipidemia) 09/03/2013  . Adult hypothyroidism 09/03/2013    Orientation RESPIRATION BLADDER Height & Weight     Self, Place  Normal Incontinent Weight: 162 lb (73.483 kg) Height:  5\' 3"  (160 cm)  BEHAVIORAL SYMPTOMS/MOOD NEUROLOGICAL BOWEL NUTRITION STATUS   (none)  (none) Continent Diet  AMBULATORY STATUS COMMUNICATION OF NEEDS Skin   Independent Verbally Normal                       Personal Care Assistance Level of Assistance  Bathing, Dressing Bathing Assistance: Independent   Dressing Assistance: Independent     Functional Limitations Info             SPECIAL CARE FACTORS FREQUENCY                       Contractures Contractures Info: Not present    Additional Factors Info                  Discharge Medications     Medication List    ASK your doctor about these  medications       acetaminophen 325 MG tablet  Commonly known as: TYLENOL  Take 650 mg by mouth every 6 (six) hours as needed.     ALPRAZolam 0.25 MG tablet  Commonly known as: XANAX  Take 0.25 mg by mouth at bedtime as needed.     aspirin 81 MG chewable tablet  Chew 81 mg by mouth daily.     atenolol 50 MG tablet  Commonly known as: TENORMIN  Take 50 mg by mouth daily.     BENEFIBER DRINK MIX PO  Take 1 Dose by mouth daily. Reported on 06/14/2015     carbidopa-levodopa 50-200 MG tablet  Commonly known as: SINEMET CR  Take 1 tablet by mouth 2 (two) times daily.     citalopram 20 MG tablet  Commonly known as: CELEXA  Take 20 mg by mouth daily. Reported on 06/14/2015     conjugated estrogens vaginal cream  Commonly known as: PREMARIN  Place 1 Applicatorful vaginally 2 (two) times a week.     donepezil 5 MG tablet  Commonly known as: ARICEPT  Reported on 06/14/2015     levothyroxine 137 MCG tablet  Commonly known as: SYNTHROID, LEVOTHROID  Take 137 mcg by mouth daily before breakfast. Reported on 06/14/2015  omeprazole 20 MG capsule  Commonly known as: PRILOSEC  Take 20 mg by mouth daily. Reported on 06/14/2015     oxyCODONE-acetaminophen 5-325 MG tablet  Commonly known as: ROXICET  Take 1 tablet by mouth every 6 (six) hours as needed for severe pain.     solifenacin 5 MG tablet  Commonly known as: VESICARE  Take 5 mg by mouth daily. Reported on 06/14/2015     triamterene-hydrochlorothiazide 37.5-25 MG tablet  Commonly known as: MAXZIDE-25  Take 0.5 tablets by mouth daily. Reported on 06/14/2015     TRIMO-SAN 0.025 % Gel  Generic drug: OXYQUINOLONE SULFATE VAGINAL  USE AS DIRECTED     Vitamin D 2000 units tablet  Take 2,000 Units by mouth daily. Reported on 06/14/2015                Discharge Medications: Please see discharge summary for a list of discharge  medications.  Relevant Imaging Results:  Relevant Lab Results:   Additional Information    Shela Leff, LCSW

## 2015-06-20 LAB — CULTURE, BLOOD (ROUTINE X 2)

## 2015-06-21 ENCOUNTER — Encounter: Payer: Self-pay | Admitting: Obstetrics and Gynecology

## 2015-06-21 ENCOUNTER — Telehealth: Payer: Self-pay | Admitting: Obstetrics and Gynecology

## 2015-06-21 MED ORDER — NYSTATIN 100000 UNIT/GM EX OINT: 1.0000 "application " | TOPICAL_OINTMENT | Freq: Two times a day (BID) | CUTANEOUS | Status: DC

## 2015-06-21 MED ORDER — FLUCONAZOLE 150 MG PO TABS: 150.0000 mg | ORAL_TABLET | Freq: Every day | ORAL | Status: DC

## 2015-06-21 MED ORDER — TRIAMCINOLONE ACETONIDE 0.1 % EX OINT: 1.0000 "application " | TOPICAL_OINTMENT | Freq: Two times a day (BID) | CUTANEOUS | Status: AC

## 2015-06-21 NOTE — Clinical Social Work Note (Signed)
Received call from Sims at [facility] today: [phone] stating that they had questions about medications for patient, who was discharged two days ago. CSW contacted the discharging physician, Dr. Serita Grit, and gave him the number for Estill Bamberg so he could call her directly to clarify medications. Shela Leff MSW,LCSW [phone]

## 2015-06-21 NOTE — Telephone Encounter (Signed)
Paula(daughter) states pt has been given nystatin powder which is not helping her bottom. Her bottom is red, irritated, and itching. Advised I will send in nystatin and triamcinolone. Requested diflucan also. Per paula I need to give a verbal order to [facility]. I have called 3-4 times and the phone will ring and then hangs up. Called admin office spoke with Pam at [phone]. She is going to have someone call me back so I can give a verbal for these meds. Spoke with Daphene- took verbal but needs order. Printed rx and will have Dr D sign and I will fax in the am.  Will send in meds to [facility]. If no better after 7 days, she will need to be seen.

## 2015-06-21 NOTE — Telephone Encounter (Signed)
Mother has been in hospital for 5 days with sepsis, and uti/ With all antibiotics her bottom end is in bad shape. They gave her nystatin, she is miserable and they are asking for diflucan and monistat. pharmacare order has to faxed to home also  Home fax is [phone]

## 2015-06-22 LAB — CULTURE, BLOOD (ROUTINE X 2)
CULTURE: NO GROWTH
Culture: NO GROWTH

## 2015-07-06 ENCOUNTER — Encounter: Payer: Self-pay | Admitting: Obstetrics and Gynecology

## 2015-07-06 ENCOUNTER — Ambulatory Visit (INDEPENDENT_AMBULATORY_CARE_PROVIDER_SITE_OTHER): Payer: Medicare Other | Admitting: Obstetrics and Gynecology

## 2015-07-06 VITALS — BP 126/73 | HR 73 | Wt 155.5 lb

## 2015-07-06 DIAGNOSIS — N8110 Cystocele, unspecified: Secondary | ICD-10-CM

## 2015-07-06 DIAGNOSIS — IMO0002 Reserved for concepts with insufficient information to code with codable children: Secondary | ICD-10-CM

## 2015-07-06 DIAGNOSIS — R350 Frequency of micturition: Secondary | ICD-10-CM

## 2015-07-06 DIAGNOSIS — N816 Rectocele: Secondary | ICD-10-CM | POA: Diagnosis not present

## 2015-07-06 DIAGNOSIS — Z4689 Encounter for fitting and adjustment of other specified devices: Secondary | ICD-10-CM

## 2015-07-06 LAB — POCT URINALYSIS DIPSTICK
Bilirubin, UA: NEGATIVE
Blood, UA: NEGATIVE
Glucose, UA: NEGATIVE
Ketones, UA: NEGATIVE
NITRITE UA: NEGATIVE
Spec Grav, UA: 1.02
UROBILINOGEN UA: NEGATIVE
pH, UA: 6

## 2015-07-06 NOTE — Patient Instructions (Signed)
1. Return in 6 weeks for pessary maintenance 2. Urine culture is sent today to rule out UTI

## 2015-07-06 NOTE — Telephone Encounter (Signed)
I spoke with Nevin Bloodgood (dtr) and she was advised that her exam was good. We did obtain a urine sample, she was given the results and told that a urine culture was sent. Call back number for Monday is her cell: [phone] as she (dtr) is off.

## 2015-07-06 NOTE — Progress Notes (Signed)
Expand All Collapse All   6 week pessary check No vaginal bleeding or pain, vaginal d/c Estrace twice week. Some urinary frequency is noted.  Chief complaint: 1. Pessary maintenance 2. Pelvic organ prolapse (cystocele, rectocele)  6 week pessary f/u Last visit: 06/05/2015 gellhorn 2 1/4 No discharge No odor No vaginal bleeding or pain Daughter assist with Premarin Cr. And Trimosan Has BM every day  OBJECTIVE: BP 126/73 mmHg  Pulse 73  Wt 155 lb 8 oz (70.534 kg) Pleasant elderly female in no acute distress.  Abdomen: Soft, nontender. Pelvic exam: External genitalia-normal BUS: Normal Vagina: No ulcerations;no malodor; Cervix: Surgically absent Uterus: Surgically absent RV: Normal external exam  Procedure: Gellhorn pessary is removed, cleaned, and reinserted.  IMPRESSION: 1. Normal pessary maintenance. 2. Cystocele, rectocele 3. Status post hysterectomy and BSO 4. Urinary frequency  PLAN: 1. Pessary removed, cleaned and reinserted. 2. Continue with TRIMO San gel and Premarin cream. 3. Return in 6 weeks for recheck 4.  Urinalysis with culture 6. Encourage continued use of Vesicare 5 mg at night  Brayton Mars, MD  A total of 15 minutes were spent face-to-face with the patient during this encounter and over half of that time dealt with counseling and coordination of care.  Note: This dictation was prepared with Dragon dictation along with smaller phrase technology. Any transcriptional errors that result from this process are unintentional.

## 2015-07-08 LAB — URINE CULTURE

## 2015-07-26 ENCOUNTER — Encounter: Payer: Self-pay | Admitting: Obstetrics and Gynecology

## 2015-08-22 ENCOUNTER — Other Ambulatory Visit: Payer: Self-pay | Admitting: Family Medicine

## 2015-08-22 DIAGNOSIS — Z1231 Encounter for screening mammogram for malignant neoplasm of breast: Secondary | ICD-10-CM

## 2015-08-24 ENCOUNTER — Encounter: Payer: Self-pay | Admitting: Obstetrics and Gynecology

## 2015-08-24 ENCOUNTER — Ambulatory Visit (INDEPENDENT_AMBULATORY_CARE_PROVIDER_SITE_OTHER): Payer: Medicare Other | Admitting: Obstetrics and Gynecology

## 2015-08-24 VITALS — BP 176/68 | HR 76 | Ht 65.0 in | Wt 159.6 lb

## 2015-08-24 DIAGNOSIS — N8110 Cystocele, unspecified: Secondary | ICD-10-CM

## 2015-08-24 DIAGNOSIS — N390 Urinary tract infection, site not specified: Secondary | ICD-10-CM

## 2015-08-24 DIAGNOSIS — Z4689 Encounter for fitting and adjustment of other specified devices: Secondary | ICD-10-CM

## 2015-08-24 DIAGNOSIS — N816 Rectocele: Secondary | ICD-10-CM | POA: Diagnosis not present

## 2015-08-24 DIAGNOSIS — IMO0002 Reserved for concepts with insufficient information to code with codable children: Secondary | ICD-10-CM

## 2015-08-24 LAB — POCT URINALYSIS DIPSTICK
Bilirubin, UA: NEGATIVE
Blood, UA: NEGATIVE
GLUCOSE UA: NEGATIVE
KETONES UA: NEGATIVE
LEUKOCYTES UA: NEGATIVE
Nitrite, UA: NEGATIVE
Protein, UA: NEGATIVE
SPEC GRAV UA: 1.02
Urobilinogen, UA: 0.2
pH, UA: 6

## 2015-08-24 NOTE — Progress Notes (Signed)
Chief complaint: 1. Pessary maintenance 2. Pelvic organ prolapse (cystocele, rectocele)  6 week pessary f/u Last visit: 07/06/2015 gellhorn 2 1/4 No discharge No odor No vaginal bleeding or pain Daughter assist with Premarin Cr. And Trimosan Has BM every day  OBJECTIVE: BP 176/68 mmHg  Pulse 76  Ht 5\' 5"  (1.651 m)  Wt 159 lb 9.6 oz (72.394 kg)  BMI 26.56 kg/m2 Pleasant elderly female in no acute distress.  Abdomen: Soft, nontender. Pelvic exam: External genitalia-normal BUS: Normal Vagina: No ulcerations; mild malodor; minimal yellow secretions in vault Cervix: Surgically absent Uterus: Surgically absent RV: Normal external exam  Procedure: Gellhorn pessary is removed, cleaned, and reinserted.  IMPRESSION: 1. Normal pessary maintenance. 2. Cystocele, rectocele 3. Status post hysterectomy and BSO  PLAN: 1. Pessary removed, cleaned and reinserted. 2. Continue with TRIMO San gel and Premarin cream. 3. Return in 6 weeks for recheck 4. Urinalysis with culture 6. Encourage continued use of Vesicare 5 mg at night  A total of 15 minutes were spent face-to-face with the patient during this encounter and over half of that time dealt with counseling and coordination of care.   Brayton Mars, MD  Note: This dictation was prepared with Dragon dictation along with smaller phrase technology. Any transcriptional errors that result from this process are unintentional.

## 2015-08-24 NOTE — Addendum Note (Signed)
Addended by: Elouise Munroe on: 08/24/2015 11:27 AM   Modules accepted: Orders

## 2015-08-24 NOTE — Patient Instructions (Signed)
1. Return in 6 weeks for pessary maintenance 2. Continue using Trimosan gel weekly and Premarin cream weekly

## 2015-08-26 LAB — URINE CULTURE

## 2015-08-29 ENCOUNTER — Encounter: Payer: Self-pay | Admitting: Obstetrics and Gynecology

## 2015-09-05 ENCOUNTER — Telehealth: Payer: Self-pay | Admitting: *Deleted

## 2015-09-05 DIAGNOSIS — N390 Urinary tract infection, site not specified: Secondary | ICD-10-CM

## 2015-09-05 NOTE — Telephone Encounter (Signed)
Nevin Bloodgood (pts daughter) aware referral put in to [facility]. If no response with in 10 bus days Nevin Bloodgood will call me back.

## 2015-09-05 NOTE — Telephone Encounter (Signed)
Patient daughter called and stated that the patient was seen in the ER on Sunday for a bad UTI. She was wondering since Dr. Tennis Must sees the patient if he can refer her to a Urologist. Patient daughter is requesting a call back . Call back number [phone] press [phone].

## 2015-09-07 ENCOUNTER — Other Ambulatory Visit: Payer: Self-pay | Admitting: Family Medicine

## 2015-09-07 ENCOUNTER — Ambulatory Visit
Admission: RE | Admit: 2015-09-07 | Discharge: 2015-09-07 | Disposition: A | Discharge status: Home / Self Care | Payer: Medicare Other | Source: Ambulatory Visit | Attending: Family Medicine | Admitting: Family Medicine

## 2015-09-07 DIAGNOSIS — Z1231 Encounter for screening mammogram for malignant neoplasm of breast: Secondary | ICD-10-CM

## 2015-09-15 ENCOUNTER — Ambulatory Visit (INDEPENDENT_AMBULATORY_CARE_PROVIDER_SITE_OTHER): Payer: Medicare Other | Admitting: Urology

## 2015-09-15 ENCOUNTER — Encounter: Payer: Self-pay | Admitting: Urology

## 2015-09-15 VITALS — BP 147/92 | HR 70 | Ht 63.0 in | Wt 160.0 lb

## 2015-09-15 DIAGNOSIS — N390 Urinary tract infection, site not specified: Secondary | ICD-10-CM

## 2015-09-15 LAB — BLADDER SCAN AMB NON-IMAGING

## 2015-09-15 MED ORDER — NITROFURANTOIN MACROCRYSTAL 50 MG PO CAPS: 50.0000 mg | ORAL_CAPSULE | Freq: Every day | ORAL | Status: DC

## 2015-09-15 NOTE — Progress Notes (Signed)
Consultation for urinary tract infection referred by Dr. Enzo Bi. Patient is a pleasant 81 year old female with history of dementia who lives in a nursing home. She had sepsis with urine and blood cultures positive for Escherichia coli in February. urine cultures March 2017 and April 2017 grew 10-50,000K growth. This past weekend she had a low-grade temperature and although she had no localizing symptoms to the bladder urine culture was obtained and grew Escherichia coli. She is on nitrofurantoin. She typically voids with a good stream but has some urgency and hesitancy. No frequency. She's had no gross hematuria. She has a history of diverticulitis. No pneumaturia. Patient has a history of hysterectomy/BSO with a rectocele and cystocele managed with a pessary. She was seen last month when the pessary was removed cleaned and reinserted and seem to be in proper position. She is also maintained on TRIMO-SAN gel and Premarin cream.  She is here with her daughters today. One daughter works as a Marine scientist in ophthalmology.  February 2017 BUN of 20, creatinine 0.89  Past medical history reviewed. Past surgical history reviewed. Family history reviewed. Social history reviewed. Medications reviewed. Allergies reviewed.  13 system review of systems obtained and was positive for the following: Easy bruising, depression, anxiety  Physical exam: Patient pleasant, in no acute distress Neurologic: Alert and oriented 3, no focal deficits  Bladder scan PVR = 0 mL. Patient could not leave a specimen today.  Assessment/plan: Discussed with the patient and her daughters and that her mom will likely always be colonized and bacteriuria is almost expected. We discussed the difference then asymptomatic bacteriuria and true urinary tract infection with objective signs of infection. We discussed antibiotics have not been shown to prevent symptomatic infections. They are optimizing conditions to prevent urinary tract  infection such as the pessary, Premarin cream.  she is emptying her bladder. or upper tracts have not been screened and is something they want to do. Will start with a renal ultrasound. Also with her history of diverticulitis will set her up for a cystoscopy. In the meantime I'll start her on a low-dose nitrofurantoin 50 mg so that urine will be clear when we perform cystoscopy. We discussed risks of nitrofurantoin such as peripheral neuropathy and pulmonary fibrosis among others. They elect to start antibiotic.  Plan nitrofurantoin low-dose nightly for the time being, renal ultrasound and cystoscopy.

## 2015-09-15 NOTE — Addendum Note (Signed)
Addended by: Wilson Singer on: 09/15/2015 03:58 PM   Modules accepted: Orders

## 2015-09-21 ENCOUNTER — Ambulatory Visit
Admission: RE | Admit: 2015-09-21 | Discharge: 2015-09-21 | Disposition: A | Discharge status: Home / Self Care | Payer: Medicare Other | Source: Ambulatory Visit | Attending: Urology | Admitting: Urology

## 2015-09-21 DIAGNOSIS — N390 Urinary tract infection, site not specified: Secondary | ICD-10-CM | POA: Diagnosis present

## 2015-09-25 ENCOUNTER — Ambulatory Visit (INDEPENDENT_AMBULATORY_CARE_PROVIDER_SITE_OTHER): Payer: Medicare Other | Admitting: Urology

## 2015-09-25 DIAGNOSIS — N816 Rectocele: Secondary | ICD-10-CM

## 2015-09-25 DIAGNOSIS — N390 Urinary tract infection, site not specified: Secondary | ICD-10-CM

## 2015-09-25 DIAGNOSIS — N8110 Cystocele, unspecified: Secondary | ICD-10-CM

## 2015-09-25 DIAGNOSIS — N952 Postmenopausal atrophic vaginitis: Secondary | ICD-10-CM | POA: Diagnosis not present

## 2015-09-25 DIAGNOSIS — IMO0002 Reserved for concepts with insufficient information to code with codable children: Secondary | ICD-10-CM

## 2015-09-25 LAB — URINALYSIS, COMPLETE
Bilirubin, UA: NEGATIVE
Glucose, UA: NEGATIVE
Ketones, UA: NEGATIVE
NITRITE UA: NEGATIVE
PH UA: 7.5 (ref 5.0–7.5)
Specific Gravity, UA: 1.02 (ref 1.005–1.030)
Urobilinogen, Ur: 2 mg/dL — ABNORMAL HIGH (ref 0.2–1.0)

## 2015-09-25 LAB — MICROSCOPIC EXAMINATION: WBC, UA: >30 /hpf — AB (ref 0–?)

## 2015-09-25 NOTE — Progress Notes (Signed)
09/25/2015 10:56 AM   Tina Gilbert February 06, 1925 [id number]  Referring provider: Derinda Late, MD [phone] [address] [address] [place], [place] [address]  Chief Complaint  Patient presents with  . Cysto    HPI:  1 - Recurrent Cystitis -  Many episodes simple cystitis, all coliforms. One episode urosepsis 06/2015. Renlal [place] 06/2015 w/o stones or hydro. PVR "82mL" (normal) 2017. Cysto 2017 unremarkable (no strictures / fistuale / diverticulase). Now manages with macrobid nightly supression.   2 - Atrophic Vaginitis - on premarin at [facility] for atrophy and decrease erosion risk form pessary. Pelvic 2017 with healthy vaginal tissues  3 - Pelvic Organ Prolapse - manages with pessary per GYN for combination cystocele / rectocele. Is compliant with f/u checks and cleanings.   Today "Tina Gilbert" is seen for cysto to complete eval for recurrent cystitis. No interval fevrile UTI.    PMH: Past Medical History  Diagnosis Date  . Hypertension   . Osteoporosis   . GERD (gastroesophageal reflux disease)   . Anxiety   . History of UTI   . Bladder irritability   . Asymmetrical breasts   . Hematuria   . Rectocele     moderate  . Pessary maintenance   . Cystocele     gellhorn 2 1/4   . Vaginal atrophy   . Parkinson disease ([place])   . Hypothyroidism   . Anemia   . Meningioma ([place])   . DDD (degenerative disc disease), cervical   . Dementia   . Alzheimer's dementia   . Parkinson's disease ([place])   . Acid reflux   . Anxiety   . Arthritis     Surgical History: Past Surgical History  Procedure Laterality Date  . Colon surgery    . Abdominal hysterectomy    . Total thyroidectomy    . Carpal tunnel release Bilateral   . Ovarian mass removal    . Colonoscopy with propofol N/A 11/28/2014    Procedure: COLONOSCOPY WITH PROPOFOL;  Surgeon: Hulen Luster, MD;  Location: [facility] ENDOSCOPY;  Service: Gastroenterology;  Laterality: N/A;  .  Esophagogastroduodenoscopy (egd) with propofol N/A 11/28/2014    Procedure: ESOPHAGOGASTRODUODENOSCOPY (EGD) WITH PROPOFOL;  Surgeon: Hulen Luster, MD;  Location: [facility] ENDOSCOPY;  Service: Gastroenterology;  Laterality: N/A;    Home Medications:    Medication List       This list is accurate as of: 09/25/15 10:56 AM.  Always use your most recent med list.               acetaminophen 325 MG tablet  Commonly known as:  TYLENOL  Take 650 mg by mouth every 6 (six) hours as needed.     ALPRAZolam 0.25 MG tablet  Commonly known as:  XANAX  Take 0.25 mg by mouth at bedtime as needed.     BENEFIBER DRINK MIX PO  Take 1 Dose by mouth daily. Reported on 06/14/2015     carbidopa-levodopa 50-200 MG tablet  Commonly known as:  SINEMET CR  Take 1 tablet by mouth 2 (two) times daily.     citalopram 20 MG tablet  Commonly known as:  CELEXA  Take 20 mg by mouth daily. Reported on 06/14/2015     conjugated estrogens vaginal cream  Commonly known as:  PREMARIN  Place 1 Applicatorful vaginally 2 (two) times a week.     donepezil 5 MG tablet  Commonly known as:  ARICEPT  Reported on 06/14/2015  levothyroxine 137 MCG tablet  Commonly known as:  SYNTHROID, LEVOTHROID  Take 137 mcg by mouth daily before breakfast. Reported on 06/14/2015     metoprolol tartrate 25 MG tablet  Commonly known as:  LOPRESSOR  Take 1 tablet (25 mg total) by mouth 2 (two) times daily.     nitrofurantoin 50 MG capsule  Commonly known as:  MACRODANTIN  Take 1 capsule (50 mg total) by mouth at bedtime.     nystatin ointment  Commonly known as:  MYCOSTATIN  Apply 1 application topically 2 (two) times daily.     omeprazole 20 MG capsule  Commonly known as:  PRILOSEC  Take 20 mg by mouth daily. Reported on 06/14/2015     oxyCODONE-acetaminophen 5-325 MG tablet  Commonly known as:  ROXICET  Take 1 tablet by mouth every 6 (six) hours as needed for severe pain.     polyethylene glycol powder powder  Commonly known as:   GLYCOLAX/MIRALAX     solifenacin 5 MG tablet  Commonly known as:  VESICARE  Take 5 mg by mouth daily. Reported on 06/14/2015     traZODone 100 MG tablet  Commonly known as:  DESYREL     triamcinolone ointment 0.1 %  Commonly known as:  KENALOG  Apply 1 application topically 2 (two) times daily.     triamterene-hydrochlorothiazide 37.5-25 MG tablet  Commonly known as:  MAXZIDE-25     TRIMO-SAN 0.025 % Gel  Generic drug:  OXYQUINOLONE SULFATE VAGINAL  USE AS DIRECTED     Vitamin D 2000 units tablet  Take 2,000 Units by mouth daily. Reported on 06/14/2015        Allergies:  Allergies  Allergen Reactions  . Avelox [Moxifloxacin Hcl In Nacl] Other (See Comments)    Reaction:  Positive ANA and bilateral iritis   . Celebrex [Celecoxib] Other (See Comments)    Reaction:  Positive ANA and bilateral iritis   . Memantine Hcl Other (See Comments)    confusion  . Tramadol Other (See Comments)    Reaction:  Hallucinations     Family History: Family History  Problem Relation Age of Onset  . CAD    . Kidney cancer Father   . Bladder Cancer Neg Hx   . Prostate cancer Neg Hx     Social History:  reports that she has never smoked. She has never used smokeless tobacco. She reports that she does not drink alcohol or use illicit drugs.    Review of Systems  Gastrointestinal (upper)  : Negative for upper GI symptoms  Gastrointestinal (lower) : Negative for lower GI symptoms  Constitutional : Negative for symptoms  Skin: Negative for skin symptoms  Eyes: Negative for eye symptoms  Ear/Nose/Throat : Negative for Ear/Nose/Throat symptoms  Hematologic/Lymphatic: Negative for Hematologic/Lymphatic symptoms  Cardiovascular : Negative for cardiovascular symptoms  Respiratory : Negative for respiratory symptoms  Endocrine: Negative for endocrine symptoms  Musculoskeletal: Negative for musculoskeletal symptoms  Neurological: Negative for neurological  symptoms  Psychologic: Negative for psychiatric symptoms    Physical Exam: There were no vitals taken for this visit.  Constitutional:   No acute distress. Daughter with her today. Stigmata of fairly advanced dementia.  HEENT: [place] AT, moist mucus membranes.  Trachea midline, no masses. Cardiovascular: No clubbing, cyanosis, or edema. Respiratory: Normal respiratory effort, no increased work of breathing. GI: Abdomen is soft, nontender, nondistended, no abdominal masses GU: No CVA tenderness. Pessary in good position w/o erosion. Introital tissue with minimal atrophy.  Skin: No  rashes, bruises or suspicious lesions. Lymph: No cervical or inguinal adenopathy. Neurologic: Grossly intact, no focal deficits, moving all 4 extremities. Psychiatric: Normal mood and affect.  Laboratory Data: Lab Results  Component Value Date   WBC 7.6 06/17/2015   HGB 9.6* 06/17/2015   HCT 28.5* 06/17/2015   MCV 93.8 06/17/2015   PLT 184 06/17/2015    Lab Results  Component Value Date   CREATININE 0.89 06/19/2015    No results found for: PSA  No results found for: TESTOSTERONE  No results found for: HGBA1C  Urinalysis    Component Value Date/Time   COLORURINE YELLOW* 06/14/2015 1950   APPEARANCEUR CLOUDY* 06/14/2015 1950   LABSPEC 1.014 06/14/2015 1950   PHURINE 5.0 06/14/2015 1950   GLUCOSEU NEGATIVE 06/14/2015 1950   HGBUR 2+* 06/14/2015 1950   BILIRUBINUR neg 08/24/2015 1126   [place] 06/14/2015 1950   KETONESUR TRACE* 06/14/2015 1950   PROTEINUR neg 08/24/2015 1126   PROTEINUR 100* 06/14/2015 1950   UROBILINOGEN 0.2 08/24/2015 1126   NITRITE neg 08/24/2015 1126   NITRITE NEGATIVE 06/14/2015 1950   LEUKOCYTESUR Negative 08/24/2015 1126    Pertinent Imaging: As per HPI     Cystoscopy Procedure Note  Patient identification was confirmed, informed consent was obtained, and patient was prepped using Betadine solution.  Lidocaine jelly was administered per urethral  meatus.    Preoperative abx where received prior to procedure.    Procedure: - Flexible cystoscope introduced, without any difficulty.   - Thorough search of the bladder revealed:    normal urethral meatus       no stones    no ulcers     no tumors    no urethral polyps    no trabeculation   Mild mucosal edema c/w recent prior cystitis.  - Ureteral orifices were normal in position and appearance.  Post-Procedure: - Patient tolerated the procedure well   Assessment & Plan:    1 - Recurrent Cystitis - no surgically modifiable factors on complete eval. Continue macrobid supression per pt / familiy reqeust. Strongly suggest AGAINST andy additional ABX unless cath-specimen proven bacteruria in setting of new obhjective symptoms (fever, hematuria, mental status changes). Voided specimens will be completely unreliable with her cognition and presence of pessary.   2 - Atrophic Vaginitis - agree with continued premairn use to make pessary safer and to also acidify introitus / help UTI risk.  3 - Pelvic Organ Prolapse - resolved with pessary, per GYN, reinforced need for periodic surveillance / cleaning.  4 - RTC 1 year with NP, then prn v. Anually.     No Follow-up on file.  Alexis Frock, [facility] [address] [place], [place] [address] [phone]

## 2015-09-28 ENCOUNTER — Encounter: Payer: Self-pay | Admitting: Obstetrics and Gynecology

## 2015-10-05 ENCOUNTER — Ambulatory Visit (INDEPENDENT_AMBULATORY_CARE_PROVIDER_SITE_OTHER): Payer: Medicare Other | Admitting: Obstetrics and Gynecology

## 2015-10-05 ENCOUNTER — Encounter: Payer: Self-pay | Admitting: Obstetrics and Gynecology

## 2015-10-05 VITALS — BP 117/68 | HR 76 | Ht 65.0 in | Wt 157.7 lb

## 2015-10-05 DIAGNOSIS — N8110 Cystocele, unspecified: Secondary | ICD-10-CM

## 2015-10-05 DIAGNOSIS — Z4689 Encounter for fitting and adjustment of other specified devices: Secondary | ICD-10-CM | POA: Diagnosis not present

## 2015-10-05 DIAGNOSIS — IMO0002 Reserved for concepts with insufficient information to code with codable children: Secondary | ICD-10-CM

## 2015-10-05 DIAGNOSIS — N816 Rectocele: Secondary | ICD-10-CM | POA: Diagnosis not present

## 2015-10-05 DIAGNOSIS — N390 Urinary tract infection, site not specified: Secondary | ICD-10-CM | POA: Diagnosis not present

## 2015-10-05 NOTE — Progress Notes (Signed)
Chief complaint: 1. Pessary maintenance 2. Pelvic organ prolapse (cystocele, rectocele)  6 week pessary f/u Last visit: 08/24/2015 gellhorn 2 1/4 No discharge No odor No vaginal bleeding or pain Daughter assist with Premarin Cr. And Trimosan Has BM every day  Today was noted to be delayed in the bathroom playing with her stool. Urine sample could not be obtained.  OBJECTIVE: BP 117/68 mmHg  Pulse 76  Ht 5\' 5"  (1.651 m)  Wt 157 lb 11.2 oz (71.532 kg)  BMI 26.24 kg/m2 Elderly female in no acute distress; Alzheimer's dementia changes noted Abdomen: Soft, nontender. Pelvic exam: External genitalia-normal; stained with loose stool BUS: Normal Vagina: No ulcerations; mild malodor; minimal yellow secretions in vault; minimal loose stool cleaned from vault Cervix: Surgically absent Uterus: Surgically absent RV: Normal external exam  ASSESSMENT: 1. Pessary maintenance-normal 2. Pelvic organ prolapse (cystocele, rectocele) 3. Status post hysterectomy and BSO  PLAN: 1. Pessary is removed, cleaned, and reinserted 2. Continue with TRIMO San gel and Premarin cream. 3. Return in 6 weeks for recheck  A total of 15 minutes were spent face-to-face with the patient during this encounter and over half of that time dealt with counseling and coordination of care.  Brayton Mars, MD  Note: This dictation was prepared with Dragon dictation along with smaller phrase technology. Any transcriptional errors that result from this process are unintentional.

## 2015-10-05 NOTE — Patient Instructions (Signed)
1. Return in 8 weeks for pessary maintenance 2. Continue Trimosan gel weekly 3. Continue Premarin cream weekly

## 2015-11-15 ENCOUNTER — Encounter: Payer: Self-pay | Admitting: Obstetrics and Gynecology

## 2015-11-16 ENCOUNTER — Ambulatory Visit (INDEPENDENT_AMBULATORY_CARE_PROVIDER_SITE_OTHER): Payer: Medicare Other | Admitting: Obstetrics and Gynecology

## 2015-11-16 ENCOUNTER — Other Ambulatory Visit (INDEPENDENT_AMBULATORY_CARE_PROVIDER_SITE_OTHER): Payer: Medicare Other

## 2015-11-16 VITALS — BP 152/71 | HR 77 | Ht 65.0 in | Wt 161.8 lb

## 2015-11-16 DIAGNOSIS — N390 Urinary tract infection, site not specified: Secondary | ICD-10-CM

## 2015-11-16 LAB — POCT URINALYSIS DIPSTICK
GLUCOSE UA: NEGATIVE
KETONES UA: NEGATIVE
Nitrite, UA: NEGATIVE
SPEC GRAV UA: 1.02
Urobilinogen, UA: 0.2
pH, UA: 5

## 2015-11-17 NOTE — Progress Notes (Signed)
Patient presented for pessary maintenance. However, appointment had to be rescheduled because of surgical emergency.  Brayton Mars, MD

## 2015-11-18 LAB — URINE CULTURE

## 2015-11-19 ENCOUNTER — Emergency Department: Payer: Medicare Other

## 2015-11-19 ENCOUNTER — Emergency Department
Admission: EM | Admit: 2015-11-19 | Discharge: 2015-11-20 | Disposition: A | Discharge status: Home / Self Care | Payer: Medicare Other | Attending: Emergency Medicine | Admitting: Emergency Medicine

## 2015-11-19 ENCOUNTER — Encounter: Payer: Self-pay | Admitting: Emergency Medicine

## 2015-11-19 DIAGNOSIS — Y929 Unspecified place or not applicable: Secondary | ICD-10-CM | POA: Diagnosis not present

## 2015-11-19 DIAGNOSIS — G20 Parkinson's disease: Secondary | ICD-10-CM | POA: Insufficient documentation

## 2015-11-19 DIAGNOSIS — Z79899 Other long term (current) drug therapy: Secondary | ICD-10-CM | POA: Diagnosis not present

## 2015-11-19 DIAGNOSIS — E785 Hyperlipidemia, unspecified: Secondary | ICD-10-CM | POA: Insufficient documentation

## 2015-11-19 DIAGNOSIS — S0083XA Contusion of other part of head, initial encounter: Secondary | ICD-10-CM

## 2015-11-19 DIAGNOSIS — W19XXXA Unspecified fall, initial encounter: Secondary | ICD-10-CM

## 2015-11-19 DIAGNOSIS — S59901A Unspecified injury of right elbow, initial encounter: Secondary | ICD-10-CM | POA: Diagnosis present

## 2015-11-19 DIAGNOSIS — E039 Hypothyroidism, unspecified: Secondary | ICD-10-CM | POA: Insufficient documentation

## 2015-11-19 DIAGNOSIS — S51011A Laceration without foreign body of right elbow, initial encounter: Secondary | ICD-10-CM

## 2015-11-19 DIAGNOSIS — M810 Age-related osteoporosis without current pathological fracture: Secondary | ICD-10-CM | POA: Diagnosis not present

## 2015-11-19 DIAGNOSIS — M5030 Other cervical disc degeneration, unspecified cervical region: Secondary | ICD-10-CM | POA: Diagnosis not present

## 2015-11-19 DIAGNOSIS — Y999 Unspecified external cause status: Secondary | ICD-10-CM | POA: Insufficient documentation

## 2015-11-19 DIAGNOSIS — Y939 Activity, unspecified: Secondary | ICD-10-CM | POA: Insufficient documentation

## 2015-11-19 DIAGNOSIS — I10 Essential (primary) hypertension: Secondary | ICD-10-CM | POA: Insufficient documentation

## 2015-11-19 NOTE — ED Notes (Signed)
Patient presents to the ED post fall at [facility].  Patient is confused at baseline and has a laceration to her right elbow and hematoma to her right forehead.  Patient takes a baby aspirin daily.  Patient is alert and oriented to self only.  Patient smiling and is in no obvious distress at this time.

## 2015-11-19 NOTE — ED Notes (Signed)
Spoke with patient and family who stated they were considering leaving.  Reassured that we were working to get everyone back and seen.  Voiced understanding and will stay.

## 2015-11-20 NOTE — Discharge Instructions (Signed)
1. Keep Duoderm skincare dressing on the right elbow until Thursday, July 20. 2. Apply thin layer of Neosporin ointment and cover with gauze daily from Thursday to Sunday, July 23. 3. Patient may bathe/shower as usual. 4. Return to the ER for worsening symptoms, persistent vomiting, difficulty breathing or other concerns.  Facial or Scalp Contusion A facial or scalp contusion is a deep bruise on the face or head. Injuries to the face and head generally cause a lot of swelling, especially around the eyes. Contusions are the result of an injury that caused bleeding under the skin. The contusion may turn blue, purple, or yellow. Minor injuries will give you a painless contusion, but more severe contusions may stay painful and swollen for a few weeks.  CAUSES  A facial or scalp contusion is caused by a blunt injury or trauma to the face or head area.  SIGNS AND SYMPTOMS   Swelling of the injured area.   Discoloration of the injured area.   Tenderness, soreness, or pain in the injured area.  DIAGNOSIS  The diagnosis can be made by taking a medical history and doing a physical exam. An X-ray exam, CT scan, or MRI may be needed to determine if there are any associated injuries, such as broken bones (fractures). TREATMENT  Often, the best treatment for a facial or scalp contusion is applying cold compresses to the injured area. Over-the-counter medicines may also be recommended for pain control.  HOME CARE INSTRUCTIONS   Only take over-the-counter or prescription medicines as directed by your health care provider.   Apply ice to the injured area.   Put ice in a plastic bag.   Place a towel between your skin and the bag.   Leave the ice on for 20 minutes, 2-3 times a day.  SEEK MEDICAL CARE IF:  You have bite problems.   You have pain with chewing.   You are concerned about facial defects. SEEK IMMEDIATE MEDICAL CARE IF:  You have severe pain or a headache that is not  relieved by medicine.   You have unusual sleepiness, confusion, or personality changes.   You throw up (vomit).   You have a persistent nosebleed.   You have double vision or blurred vision.   You have fluid drainage from your nose or ear.   You have difficulty walking or using your arms or legs.  MAKE SURE YOU:   Understand these instructions.  Will watch your condition.  Will get help right away if you are not doing well or get worse.   This information is not intended to replace advice given to you by your health care provider. Make sure you discuss any questions you have with your health care provider.   Document Released: 05/30/2004 Document Revised: 05/13/2014 Document Reviewed: 12/03/2012 Elsevier Interactive Patient Education 2016 [employer]  Skin Tear Care A skin tear is a wound in which the top layer of skin has peeled off. This is a common problem with aging because the skin becomes thinner and more fragile as a person gets older. In addition, some medicines, such as oral corticosteroids, can lead to skin thinning if taken for long periods of time.  A skin tear is often repaired with tape or skin adhesive strips. This keeps the skin that has been peeled off in contact with the healthier skin beneath. Depending on the location of the wound, a bandage (dressing) may be applied over the tape or skin adhesive strips. Sometimes, during the healing process,  the skin turns black and dies. Even when this happens, the torn skin acts as a good dressing until the skin underneath gets healthier and repairs itself. HOME CARE INSTRUCTIONS   Change dressings once per day or as directed by your caregiver.  Gently clean the skin tear and the area around the tear using saline solution or mild soap and water.  Do not rub the injured skin dry. Let the area air dry.  Apply petroleum jelly or an antibiotic cream or ointment to keep the tear moist. This will help the wound heal.  Do not allow a scab to form.  If the dressing sticks before the next dressing change, moisten it with warm soapy water and gently remove it.  Protect the injured skin until it has healed.  Only take over-the-counter or prescription medicines as directed by your caregiver.  Take showers or baths using warm soapy water. Apply a new dressing after the shower or bath.  Keep all follow-up appointments as directed by your caregiver.  SEEK IMMEDIATE MEDICAL CARE IF:   You have redness, swelling, or increasing pain in the skin tear.  You havepus coming from the skin tear.  You have chills.  You have a red streak that goes away from the skin tear.  You have a bad smell coming from the tear or dressing.  You have a fever or persistent symptoms for more than 2-3 days.  You have a fever and your symptoms suddenly get worse. MAKE SURE YOU:  Understand these instructions.  Will watch this condition.  Will get help right away if your child is not doing well or gets worse.   This information is not intended to replace advice given to you by your health care provider. Make sure you discuss any questions you have with your health care provider.   Document Released: 01/15/2001 Document Revised: 01/15/2012 Document Reviewed: 11/04/2011 Elsevier Interactive Patient Education [employer].

## 2015-11-20 NOTE — ED Provider Notes (Signed)
[facility] Emergency Department Provider Note   ____________________________________________  Time seen: Approximately 1:05 AM  I have reviewed the triage vital signs and the nursing notes.   HISTORY  Chief Complaint Fall  History obtained via daughter; patient's history limited by dementia  HPI Tina Gilbert is a 82 y.o. female who presents to the ED from [facility] s/p fall. Daughter states patienthad an unwitnessed fall outside and was found beside the caregiver's car. Patient denies LOC. Presents with hematoma to right eye and right elbow laceration. Denies pain. Denies neck pain, vision changes, chest pain, shortness of breath, abdominal pain, nausea, vomiting, diarrhea. Nothing makes her symptoms better or worse. Patient is not on anticoagulants other than a baby aspirin daily. Tetanus is up-to-date.   Past Medical History  Diagnosis Date  . Hypertension   . Osteoporosis   . GERD (gastroesophageal reflux disease)   . Anxiety   . History of UTI   . Bladder irritability   . Asymmetrical breasts   . Hematuria   . Rectocele     moderate  . Pessary maintenance   . Cystocele     gellhorn 2 1/4   . Vaginal atrophy   . Parkinson disease ([place])   . Hypothyroidism   . Anemia   . Meningioma ([place])   . DDD (degenerative disc disease), cervical   . Dementia   . Alzheimer's dementia   . Parkinson's disease ([place])   . Acid reflux   . Anxiety   . Arthritis     Patient Active Problem List   Diagnosis Date Noted  . Atrophic vaginitis 09/25/2015  . UTI (lower urinary tract infection) 06/14/2015  . Cystocele 12/02/2014  . Rectocele 12/02/2014  . Abnormal brain scan 09/10/2013  . Difficulty in walking 09/10/2013  . Amnesia 09/10/2013  . 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 Parkinson's disease ([place]) 09/10/2013  . Acid reflux 09/03/2013  . Benign essential HTN 09/03/2013  . HLD (hyperlipidemia) 09/03/2013  .  Adult hypothyroidism 09/03/2013    Past Surgical History  Procedure Laterality Date  . Colon surgery    . Abdominal hysterectomy    . Total thyroidectomy    . Carpal tunnel release Bilateral   . Ovarian mass removal    . Colonoscopy with propofol N/A 11/28/2014    Procedure: COLONOSCOPY WITH PROPOFOL;  Surgeon: Hulen Luster, MD;  Location[facility] END[facility]COPY;  Service: Gastroenterology;  Laterality: N/A;  . Esophagogastroduodenoscopy (egd) with propofol N/A 11/28/2014    Procedure: ESOPHAGOGASTRODUODENOSCOPY (EGD) WITH PROPOFOL;  Surgeon: Hulen Luster, MD;  Location: [facility] ENDOSCOPY;  Service: Gastroenterology;  Laterality: N/A;    Current Outpatient Rx  Name  Route  Sig  Dispense  Refill  . acetaminophen (TYLENOL) 325 MG tablet   Oral   Take 650 mg by mouth every 6 (six) hours as needed.         . ALPRAZolam (XANAX) 0.25 MG tablet   Oral   Take 0.25 mg by mouth at bedtime as needed.          . carbidopa-levodopa (SINEMET CR) 50-200 MG per tablet   Oral   Take 1 tablet by mouth 2 (two) times daily.         . Cholecalciferol (VITAMIN D) 2000 UNITS tablet   Oral   Take 2,000 Units by mouth daily. Reported on 06/14/2015         . citalopram (CELEXA) 20 MG tablet   Oral   Take  20 mg by mouth daily. Reported on 06/14/2015         . conjugated estrogens (PREMARIN) vaginal cream   Vaginal   Place 1 Applicatorful vaginally 2 (two) times a week.         . donepezil (ARICEPT) 5 MG tablet      Reported on 06/14/2015         . levothyroxine (SYNTHROID, LEVOTHROID) 137 MCG tablet   Oral   Take 137 mcg by mouth daily before breakfast. Reported on 06/14/2015         . metoprolol tartrate (LOPRESSOR) 25 MG tablet   Oral   Take 1 tablet (25 mg total) by mouth 2 (two) times daily.   60 tablet   0   . nitrofurantoin (MACRODANTIN) 50 MG capsule   Oral   Take 1 capsule (50 mg total) by mouth at bedtime.   45 capsule   0   . omeprazole (PRILOSEC) 20 MG capsule   Oral   Take 20 mg  by mouth daily. Reported on 06/14/2015         . polyethylene glycol powder (GLYCOLAX/MIRALAX) powder               . solifenacin (VESICARE) 5 MG tablet   Oral   Take 5 mg by mouth daily. Reported on 06/14/2015         . traZODone (DESYREL) 100 MG tablet               . triamcinolone ointment (KENALOG) 0.1 %   Topical   Apply 1 application topically 2 (two) times daily.   30 g   0   . triamterene-hydrochlorothiazide (MAXZIDE-25) 37.5-25 MG tablet               . TRIMO-SAN 0.025 % GEL      USE AS DIRECTED   113.4 g   3   . Wheat Dextrin (BENEFIBER DRINK MIX PO)   Oral   Take 1 Dose by mouth daily. Reported on 06/14/2015           Allergies Avelox; Celebrex; Memantine hcl; and Tramadol  Family History  Problem Relation Age of Onset  . CAD    . Kidney cancer Father   . Bladder Cancer Neg Hx   . Prostate cancer Neg Hx     Social History Social History  Substance Use Topics  . Smoking status: Never Smoker   . Smokeless tobacco: Never Used  . Alcohol Use: No    Review of Systems  Constitutional: No fever/chills. Eyes: No visual changes. ENT: Positive for right eye hematoma. No sore throat. Cardiovascular: Denies chest pain. Respiratory: Denies shortness of breath. Gastrointestinal: No abdominal pain.  No nausea, no vomiting.  No diarrhea.  No constipation. Genitourinary: Negative for dysuria. Musculoskeletal: Positive for right elbow laceration. Negative for back pain. Skin: Negative for rash. Neurological: Negative for headaches, focal weakness or numbness.  10-point ROS otherwise negative.  ____________________________________________   PHYSICAL EXAM:  VITAL SIGNS: ED Triage Vitals  Enc Vitals Group     BP 11/19/15 1831 160/79 mmHg     Pulse Rate 11/19/15 1831 83     Resp 11/19/15 1831 18     Temp 11/19/15 1831 98.6 F (37 C)     Temp Source 11/19/15 1831 Oral     SpO2 11/19/15 1831 99 %     Weight 11/19/15 1831 160 lb (72.576 kg)      Height 11/19/15 1831 5\' 5"  (1.651 m)  Head Cir --      Peak Flow --      Pain Score --      Pain Loc --      Pain Edu? --      Excl. in [place]? --     Constitutional: Alert and oriented. Well appearing and in no acute distress. Pleasantly confused. Eyes: Contusion to right lateral eye. Very mild associated swelling. No subconjunctival hematoma. Conjunctivae are normal. PERRL. EOMI. Head: Atraumatic. Nose: No congestion/rhinnorhea. Mouth/Throat: Mucous membranes are moist.  Oropharynx non-erythematous. Neck: No stridor.  No cervical spine tenderness to palpation. Cardiovascular: Normal rate, regular rhythm. Grossly normal heart sounds.  Good peripheral circulation. Respiratory: Normal respiratory effort.  No retractions. Lungs CTAB. Gastrointestinal: Soft and nontender. No distention. No abdominal bruits. No CVA tenderness. Musculoskeletal: Right elbow with small curved skin tear. No active bleeding. 2+ radial pulse. Full range of motion without pain. No joint effusions. Neurologic:  Normal speech and language. No gross focal neurologic deficits are appreciated.  Skin:  Skin is warm, dry and intact. No rash noted. Psychiatric: Mood and affect are normal. Speech and behavior are normal.  ____________________________________________   LABS (all labs ordered are listed, but only abnormal results are displayed)  Labs Reviewed - No data to display ____________________________________________  EKG  None ____________________________________________  RADIOLOGY  CT head and cervical spine interpreted per Dr. Melanee Spry: No evidence of acute intracranial abnormality. Atrophy and chronic small-vessel white matter ischemic changes.  No static evidence of acute injury to the cervical spine. Degenerative changes as described.  Right elbow complete (viewed by me, interpreted per Dr. Melanee Spry): No acute bony abnormality. ____________________________________________   PROCEDURES  Procedure(s)  performed: None  Procedures  Critical Care performed: No  ____________________________________________   INITIAL IMPRESSION / ASSESSMENT AND PLAN / ED COURSE  Pertinent labs & imaging results that were available during my care of the patient were reviewed by me and considered in my medical decision making (see chart for details).  82 year old female who presents s/p fall with facial contusion and skin tear. Due to skin tear dressing applied. Strict return precautions. Patient and daughter verbalize understanding and agree with plan of care. ____________________________________________   FINAL CLINICAL IMPRESSION(S) / ED DIAGNOSES  Final diagnoses:  Facial contusion, initial encounter  Skin tear of elbow without complication, right, initial encounter  Fall, initial encounter      NEW MEDICATIONS STARTED DURING THIS VISIT:  New Prescriptions   No medications on file     Note:  This document was prepared using Dragon voice recognition software and may include unintentional dictation errors.    Paulette Blanch, MD 11/20/15 [phone]

## 2015-11-21 ENCOUNTER — Telehealth: Payer: Self-pay

## 2015-11-21 ENCOUNTER — Other Ambulatory Visit: Payer: Self-pay

## 2015-11-21 MED ORDER — NITROFURANTOIN MACROCRYSTAL 50 MG PO CAPS: 50.0000 mg | ORAL_CAPSULE | Freq: Every day | ORAL | Status: DC

## 2015-11-21 NOTE — Telephone Encounter (Signed)
Pt daughter called questioning orders for macrobid and keflex. Made daughter aware of Dr. Lyndal Rainbow and Dr. Zettie Pho dictation. Made daughter aware Lora from [facility] called earlier today in reference to macrobid orders and both dictations were faxed over. Nevin Bloodgood then stated that Dr. Keturah Barre started pt on keflex today and wanted to know should pt take abx. Reinforced with Nevin Bloodgood to call Dr. Buckner [place] office and ask due to him rx medication. Reinforced with Nevin Bloodgood to tell Dr. Keturah Barre of Dr. Zettie Pho dictations and then let Dr. D make the final decision. Nevin Bloodgood voiced understanding of whole conversation.

## 2015-11-21 NOTE — Telephone Encounter (Signed)
[facility] aware letter [phone]) with how pt is to take Keflex and after completion of Keflex to take macrodantin 50mg  at bedtime. Darnelle Bos states she has received the letter. Also faxed to [place] at [phone].

## 2015-11-23 ENCOUNTER — Encounter: Payer: Self-pay | Admitting: Obstetrics and Gynecology

## 2015-11-23 ENCOUNTER — Ambulatory Visit: Payer: Medicare Other | Admitting: Obstetrics and Gynecology

## 2015-11-23 ENCOUNTER — Ambulatory Visit (INDEPENDENT_AMBULATORY_CARE_PROVIDER_SITE_OTHER): Payer: Medicare Other | Admitting: Obstetrics and Gynecology

## 2015-11-23 VITALS — BP 125/62 | HR 74 | Ht 65.0 in | Wt 161.3 lb

## 2015-11-23 DIAGNOSIS — IMO0002 Reserved for concepts with insufficient information to code with codable children: Secondary | ICD-10-CM

## 2015-11-23 DIAGNOSIS — N8110 Cystocele, unspecified: Secondary | ICD-10-CM | POA: Diagnosis not present

## 2015-11-23 DIAGNOSIS — N816 Rectocele: Secondary | ICD-10-CM

## 2015-11-23 DIAGNOSIS — Z4689 Encounter for fitting and adjustment of other specified devices: Secondary | ICD-10-CM | POA: Diagnosis not present

## 2015-11-23 NOTE — Patient Instructions (Addendum)
1. Pessary is removed, cleaned, and reinserted 2. Continue with TRIMO San gel and Premarin cream. 3. Return in 6 weeks for recheck 4. Recommend follow-up with primary care regarding recent fall

## 2015-11-23 NOTE — Progress Notes (Signed)
Chief complaint: 1. Pessary maintenance 2. Pelvic organ prolapse (cystocele, rectocele)  6 week pessary f/u Last visit: 08/24/2015 gellhorn 2 1/4 No discharge No odor No vaginal bleeding or pain Daughter assist with Premarin Cr. And Trimosan Has BM every day  The patient had an accident in female; was seen in the ER; has a black eye-right. I recommend that the patient be reassessed by primary care for further potential issues associated with a fall, although I do not see any suspicious findings today.  OBJECTIVE: BP 125/62 mmHg  Pulse 74  Ht 5\' 5"  (1.651 m)  Wt 161 lb 4.8 oz (73.165 kg)  BMI 26.84 kg/m2  Pleasant female in no acute distress. Obvious dementia findings appears stable HEENT: Ecchymoses right eye; patient can identify appropriate number of fingers in visual field Pelvic exam: External genitalia-normal; stained with loose stool BUS: Normal Vagina: No ulcerations; mild malodor; minimal yellow secretions in vault; minimal loose stool cleaned from vault Cervix: Surgically absent Uterus: Surgically absent RV: Normal external exam  ASSESSMENT: 1. Pessary maintenance-normal 2. Pelvic organ prolapse (cystocele, rectocele) 3. Status post hysterectomy and BSO 4. Recent fall with right eye ecchymoses on exam  PLAN: 1. Pessary is removed, cleaned, and reinserted 2. Continue with TRIMO San gel and Premarin cream. 3. Return in 6 weeks for recheck 4. Recommend follow-up with primary care regarding recent fall  A total of 15 minutes were spent face-to-face with the patient during this encounter and over half of that time dealt with counseling and coordination of care.  Brayton Mars, MD  Note: This dictation was prepared with Dragon dictation along with smaller phrase technology. Any transcriptional errors that result from this process are unintentional.

## 2016-01-04 ENCOUNTER — Ambulatory Visit (INDEPENDENT_AMBULATORY_CARE_PROVIDER_SITE_OTHER): Payer: Medicare Other | Admitting: Obstetrics and Gynecology

## 2016-01-04 VITALS — BP 130/71 | HR 80 | Ht 65.0 in | Wt 159.4 lb

## 2016-01-04 DIAGNOSIS — N816 Rectocele: Secondary | ICD-10-CM

## 2016-01-04 DIAGNOSIS — N390 Urinary tract infection, site not specified: Secondary | ICD-10-CM | POA: Diagnosis not present

## 2016-01-04 DIAGNOSIS — N8110 Cystocele, unspecified: Secondary | ICD-10-CM

## 2016-01-04 DIAGNOSIS — Z4689 Encounter for fitting and adjustment of other specified devices: Secondary | ICD-10-CM

## 2016-01-04 DIAGNOSIS — IMO0002 Reserved for concepts with insufficient information to code with codable children: Secondary | ICD-10-CM

## 2016-01-04 LAB — POCT URINALYSIS DIPSTICK
Blood, UA: NEGATIVE
GLUCOSE UA: NEGATIVE
KETONES UA: NEGATIVE
LEUKOCYTES UA: NEGATIVE
Nitrite, UA: NEGATIVE
Protein, UA: NEGATIVE
SPEC GRAV UA: 1.01
Urobilinogen, UA: NEGATIVE
pH, UA: 6.5

## 2016-01-04 NOTE — Addendum Note (Signed)
Addended by: Elouise Munroe on: 01/04/2016 12:05 PM   Modules accepted: Orders

## 2016-01-04 NOTE — Addendum Note (Signed)
Addended by: Elouise Munroe on: 01/04/2016 04:09 PM   Modules accepted: Orders

## 2016-01-04 NOTE — Patient Instructions (Signed)
1. Return in 6 weeks for pessary maintenance 2. Continue using Trimosan gel once a week intravaginal 3. Continue using Premarin cream intravaginally once a week

## 2016-01-04 NOTE — Progress Notes (Signed)
CHIEF complaint: 1. Pessary maintenance 2. Pelvic organ prolapse (cystocele, rectocele)  6 week pessary f/u Last visit: 11/23/2015 gellhorn 2 1/4 No discharge No odor No vaginal bleeding or pain Daughter assist with Premarin Cr. And Trimosan Has BM every day  OBJECTIVE:  BP 130/71   Pulse 80   Ht 5\' 5"  (1.651 m)   Wt 159 lb 6.4 oz (72.3 kg)   BMI 26.53 kg/m  Pleasant female in no acute distress. Obvious dementia findings appears stable  HEENT: normal Pelvic exam: External genitalia-normal BUS: Normal Vagina: No ulcerations; no malodor; minimal yellow secretions in vault Cervix: Surgically absent Uterus: Surgically absent RV: Normal external exam  ASSESSMENT: 1. Pessary maintenance-normal  PLAN: 1. Pessary is removed, cleaned, and reinserted 2. Continue with TRIMO San gel and Premarin cream. 3. Return in 6 weeks for recheck 4. Recommend follow-up with primary care regarding recent fall  A total of 15 minutes were spent face-to-face with the patient during this encounter and over half of that time dealt with counseling and coordination of care.  Brayton Mars, MD 2. Pelvic organ prolapse (cystocele, rectocele) 3. Status post hysterectomy and BSO

## 2016-01-05 LAB — URINE CULTURE

## 2016-01-22 ENCOUNTER — Encounter: Payer: Self-pay | Admitting: Obstetrics and Gynecology

## 2016-02-06 ENCOUNTER — Ambulatory Visit (INDEPENDENT_AMBULATORY_CARE_PROVIDER_SITE_OTHER): Payer: Medicare Other | Admitting: Urology

## 2016-02-06 ENCOUNTER — Encounter: Payer: Self-pay | Admitting: Urology

## 2016-02-06 VITALS — BP 145/72 | HR 87 | Ht 65.0 in | Wt 160.1 lb

## 2016-02-06 DIAGNOSIS — N390 Urinary tract infection, site not specified: Secondary | ICD-10-CM | POA: Diagnosis not present

## 2016-02-06 DIAGNOSIS — N952 Postmenopausal atrophic vaginitis: Secondary | ICD-10-CM | POA: Diagnosis not present

## 2016-02-06 DIAGNOSIS — N3942 Incontinence without sensory awareness: Secondary | ICD-10-CM

## 2016-02-06 LAB — URINALYSIS, COMPLETE
Bilirubin, UA: NEGATIVE
GLUCOSE, UA: NEGATIVE
KETONES UA: NEGATIVE
Leukocytes, UA: NEGATIVE
NITRITE UA: NEGATIVE
PROTEIN UA: NEGATIVE
RBC, UA: NEGATIVE
SPEC GRAV UA: 1.015 (ref 1.005–1.030)
UUROB: 1 mg/dL (ref 0.2–1.0)
pH, UA: 5.5 (ref 5.0–7.5)

## 2016-02-06 LAB — MICROSCOPIC EXAMINATION
Bacteria, UA: NONE SEEN
RBC MICROSCOPIC, UA: NONE SEEN /HPF (ref 0–?)

## 2016-02-06 LAB — BLADDER SCAN AMB NON-IMAGING: Scan Result: 53

## 2016-02-06 MED ORDER — ESTROGENS, CONJUGATED 0.625 MG/GM VA CREA: 1.0000 | TOPICAL_CREAM | Freq: Every day | VAGINAL | 12 refills | Status: DC

## 2016-02-06 NOTE — Progress Notes (Signed)
02/06/2016 4:02 PM   Tina Gilbert Section 10/24/1924 [id number]  Referring provider: Derinda Late, MD [phone] [facility] [place], [place] [address]  Chief Complaint  Patient presents with  . Recurrent UTI    HPI: Patient is a 82 -year-old Caucasian female who is referred to [place] by, Dr. Baldemar Lenis, for recurrent urinary tract infections.  Patient has Alzheimer and history is obtained from her daughter, Tina Gilbert.    Patient's daughter states that she has had several urinary tract infections over the last year.  Her symptoms with a urinary tract infection consist of increased confusion, possibly scooting her bottom across the floor and aggressive behavior.  Her daughter has not noted any complaints of gross hematuria, suprapubic pain, back pain, abdominal pain or flank pain.  She has not had any recent fevers, chills, nausea or vomiting.   She does not have a history of nephrolithiasis, GU surgery or GU trauma.   Reviewing her records,  she has had 6 documented UTI's.  These were not cath specimens.  Patient had been on suppressive Macrobid since May 2017.     + E.coli on 04/06/2015  + E.coli on 05/25/2015  + E.coli on 06/14/2015 with sepsis  + Klebsiella pneumoniae on 11/16/2015  + E.coli on 09/03/2015  + E.coli on 01/18/2016   She is not sexually active.  She is post menopausal.   She denies constipation and/or diarrhea.    She does not engage in good perineal hygiene. She does not take tub baths.   She does have incontinence.  She is using incontinence pads.   She is not having pain with bladder filling.    RUS 06/2015 w/o stones or hydro.  Cysto 2017 was unremarkable.  She is drinking not drinking a lot of water daily.    Patient's daughter is wanting an urine culture to confirm that she has recovered from her last infection.  Her UA today is unremarkable.  Her PVR was 53 mL.    She is having her pessary cleaned every 6 weeks  with gynecology.  Patient is having vaginal estrogen cream applying twice weekly.  She is also taking Vesicare.   PMH: Past Medical History:  Diagnosis Date  . Acid reflux   . Alzheimer's dementia   . Anemia   . Anxiety   . Anxiety   . Arthritis   . Asymmetrical breasts   . Bladder irritability   . Cystocele    gellhorn 2 1/4   . DDD (degenerative disc disease), cervical   . Dementia   . GERD (gastroesophageal reflux disease)   . Hematuria   . History of UTI   . Hypertension   . Hypothyroidism   . Meningioma ([place])   . Osteoporosis   . Parkinson disease ([place])   . Parkinson's disease ([place])   . Pessary maintenance   . Rectocele    moderate  . Vaginal atrophy     Surgical History: Past Surgical History:  Procedure Laterality Date  . ABDOMINAL HYSTERECTOMY    . CARPAL TUNNEL RELEASE Bilateral   . COLON SURGERY    . COLONOSCOPY WITH PROPOFOL N/A 11/28/2014   Procedure: COLONOSCOPY WITH PROPOFOL;  Surgeon: Hulen Luster, MD;  Location[facility] ENDOSCOPY;  Service: Gastroenterology;  Laterality: N/A;  . ESOPHAGOGASTRODUODENOSCOPY (EGD) WITH PROPOFOL N/A 11/28/2014   Procedure: ESOPHAGOGASTRODUODENOSCOPY (EGD) WITH PROPOFOL;  Surgeon: Hulen Luster, MD;  Location: [facility] ENDOSCOPY;  Service: Gastroenterology;  Laterality: N/A;  .  OVARIAN MASS REMOVAL    . TOTAL THYROIDECTOMY      Home Medications:    Medication List       Accurate as of 02/06/16  4:02 PM. Always use your most recent med list.          acetaminophen 325 MG tablet Commonly known as:  TYLENOL Take 650 mg by mouth every 6 (six) hours as needed.   ALPRAZolam 0.25 MG tablet Commonly known as:  XANAX Take 0.25 mg by mouth at bedtime as needed.   BENEFIBER DRINK MIX PO Take 1 Dose by mouth daily. Reported on 06/14/2015   carbidopa-levodopa 50-200 MG tablet Commonly known as:  SINEMET CR Take 1 tablet by mouth 2 (two) times daily.   citalopram 20 MG tablet Commonly known as:  CELEXA Take 20 mg by mouth daily.  Reported on 06/14/2015   conjugated estrogens vaginal cream Commonly known as:  PREMARIN Place 1 Applicatorful vaginally 2 (two) times a week.   conjugated estrogens vaginal cream Commonly known as:  PREMARIN Place 1 Applicatorful vaginally daily. Insert 0.5mg  (pea-sized amount)  On Monday, Wednesday and Friday nights.   donepezil 5 MG tablet Commonly known as:  ARICEPT Reported on 06/14/2015   GOODSENSE ASPIRIN 81 MG chewable tablet Generic drug:  aspirin Chew by mouth.   levothyroxine 137 MCG tablet Commonly known as:  SYNTHROID, LEVOTHROID Take 137 mcg by mouth daily before breakfast. Reported on 06/14/2015   metoprolol tartrate 25 MG tablet Commonly known as:  LOPRESSOR Take 1 tablet (25 mg total) by mouth 2 (two) times daily.   nitrofurantoin 50 MG capsule Commonly known as:  MACRODANTIN Take 1 capsule (50 mg total) by mouth at bedtime.   nystatin ointment Commonly known as:  MYCOSTATIN Apply 1 application topically as needed.   omeprazole 20 MG capsule Commonly known as:  PRILOSEC Take 20 mg by mouth daily. Reported on 06/14/2015   polyethylene glycol powder powder Commonly known as:  GLYCOLAX/MIRALAX   QUEtiapine 50 MG tablet Commonly known as:  SEROQUEL Take 50 mg by mouth at bedtime.   solifenacin 5 MG tablet Commonly known as:  VESICARE Take 5 mg by mouth daily. Reported on 06/14/2015   triamcinolone ointment 0.1 % Commonly known as:  KENALOG Apply 1 application topically 2 (two) times daily.   triamterene-hydrochlorothiazide 37.5-25 MG tablet Commonly known as:  MAXZIDE-25   TRIMO-SAN 0.025 % Gel Generic drug:  OXYQUINOLONE SULFATE VAGINAL USE AS DIRECTED   Vitamin D 2000 units tablet Take 2,000 Units by mouth daily. Reported on 06/14/2015       Allergies:  Allergies  Allergen Reactions  . Avelox [Moxifloxacin Hcl In Nacl] Other (See Comments)    Reaction:  Positive ANA and bilateral iritis   . Celebrex [Celecoxib] Other (See Comments)     Reaction:  Positive ANA and bilateral iritis   . Memantine Other (See Comments)    Confusion, AMS  . Memantine Hcl Other (See Comments)    confusion  . Tramadol Other (See Comments)    CONFUSION, FALLING Reaction:  Hallucinations     Family History: Family History  Problem Relation Age of Onset  . CAD    . Kidney cancer Father   . Bladder Cancer Neg Hx   . Prostate cancer Neg Hx     Social History:  reports that she has never smoked. She has never used smokeless tobacco. She reports that she does not drink alcohol or use drugs.  ROS: UROLOGY Frequent Urination?: No Hard to  postpone urination?: No Burning/pain with urination?: No Get up at night to urinate?: No Leakage of urine?: No Urine stream starts and stops?: No Trouble starting stream?: No Do you have to strain to urinate?: No Blood in urine?: No Urinary tract infection?: No Sexually transmitted disease?: No Injury to kidneys or bladder?: No Painful intercourse?: No Weak stream?: No Currently pregnant?: No Vaginal bleeding?: No Last menstrual period?: n  Gastrointestinal Nausea?: No Vomiting?: No Indigestion/heartburn?: No Diarrhea?: No Constipation?: No  Constitutional Fever: No Night sweats?: No Weight loss?: No Fatigue?: No  Skin Skin rash/lesions?: No Itching?: No  Eyes Blurred vision?: No Double vision?: No  Ears/Nose/Throat Sore throat?: No Sinus problems?: No  Hematologic/Lymphatic Swollen glands?: No Easy bruising?: No  Cardiovascular Leg swelling?: No Chest pain?: No  Respiratory Cough?: No Shortness of breath?: No  Endocrine Excessive thirst?: No  Musculoskeletal Back pain?: No Joint pain?: No  Neurological Headaches?: No Dizziness?: No  Psychologic Depression?: No Anxiety?: No  Physical Exam: BP (!) 145/72   Pulse 87   Ht 5\' 5"  (1.651 m)   Wt 160 lb 1.6 oz (72.6 kg)   BMI 26.64 kg/m   Constitutional: Well nourished. Alert and oriented, No acute  distress. HEENT: [place] AT, moist mucus membranes. Trachea midline, no masses. Cardiovascular: No clubbing, cyanosis, or edema. Respiratory: Normal respiratory effort, no increased work of breathing. GI: Abdomen is soft, non tender, non distended, no abdominal masses. Liver and spleen not palpable.  No hernias appreciated.  Stool sample for occult testing is not indicated.   GU: No CVA tenderness.  No bladder fullness or masses.  Atrophic external genitalia, normal pubic hair distribution, no lesions.  Normal urethral meatus, no lesions, no prolapse, no discharge.   No urethral masses, tenderness and/or tenderness.  Pessary in place.  Pale vagina mucosa, poor estrogen effect, no discharge, no lesions, good pelvic support, no cystocele or rectocele noted.  Cervix, uterus and adnexal/parametria are surgically absent.   Skin: No rashes, bruises or suspicious lesions. Lymph: No cervical or inguinal adenopathy. Neurologic: Grossly intact, no focal deficits, moving all 4 extremities. Psychiatric: Normal mood and affect.  Laboratory Data: Lab Results  Component Value Date   WBC 7.6 06/17/2015   HGB 9.6 (L) 06/17/2015   HCT 28.5 (L) 06/17/2015   MCV 93.8 06/17/2015   PLT 184 06/17/2015    Lab Results  Component Value Date   CREATININE 0.89 06/19/2015     Lab Results  Component Value Date   AST 18 06/14/2015   Lab Results  Component Value Date   ALT 7 (L) 06/14/2015     Urinalysis Unremarkable.  See EPIC.  Pertinent imaging Results for Tina, Gilbert (MRN [id number]) as of 02/11/2016 19:34  Ref. Range 02/06/2016 15:09  Scan Result Unknown 53   Assessment & Plan:    1. Recurrent UTI  Patient is instructed to increase her water intake until the urine is pale yellow or clear.  I have advised her to take probiotics (yogurt, oral pills or vaginal suppositories), take cranberry pills or drink the juice and use the estrogen cream.  She is to take Vitamin C 1,000 mg daily to acidify the  urine.   - Because of her history of recurrent UTI's, I have asked the patient's daughter to contact our office if she should experience symptoms of urinary tract infection so that we can CATH her for an urine specimen for urinalysis and culture. This is to prevent a skin contaminant from showing up in the  urine culture.  If she should have her symptoms after hours or cannot get to our office, she should notify her other providers that she needs a catheterized specimen for UA and culture.   - I reviewed the symptoms of a urinary tract infection with the daughter, such as a worsening of urinary urgency and frequency, dysuria, which is painful urination and not the pain of urine hitting sensitive perineal skin, hematuria, foul-smelling urine, suprapubic pain or mental status changes. Fevers, chills, nausea and or vomiting can also be signs of a possible UTI.  Positive urinalyses and positive urine cultures that are not associated with urinary symptoms should not be treated with antibiotics.    - I explained to the patient that being exposed to unnecessary antibiotics can put her at risk for increasing resistance of the bacteria to antibiotics, C. difficile and the side effects of the antibiotics.    - UA is sent for culture  - Patient's daughter would like to continue the Macrobid until culture results are available  2. Incontinence  - patient with Alzheimer, with discontinue the Vesicare- as anticholinergics can cause mental status changes  - start Myrbetriq 25 mg, samples given     - Bladder Scan (Post Void Residual) in office  - RTC in 3 weeks for PVR and symptom recheck  3. Vaginal atrophy  - increase vaginal estrogen cream to three nights weekly  4. Cystocele  - managed by gynecology  Return in about 3 weeks (around 02/27/2016) for PVR and symptom recheck .  These notes generated with voice recognition software. I apologize for typographical errors.  Zara Council, [place] [address] [place], [place] [address] [phone]

## 2016-02-06 NOTE — Patient Instructions (Signed)
Take Myrbetriq 25 mg daily.  Discontinue the Vesicare as it causes mental confusion  Insert 0.5 grams into the vagina Monday, Wednesday and Friday nights

## 2016-02-09 ENCOUNTER — Telehealth: Payer: Self-pay | Admitting: Family Medicine

## 2016-02-09 LAB — CULTURE, URINE COMPREHENSIVE

## 2016-02-09 NOTE — Telephone Encounter (Signed)
-----   Message from Nori Riis, PA-C sent at 02/09/2016  9:07 AM EDT ----- Please advise the patient's daughter that her urine culture was negative.

## 2016-02-09 NOTE — Telephone Encounter (Signed)
Patient's daughter notified.

## 2016-02-14 ENCOUNTER — Encounter: Payer: Self-pay | Admitting: Obstetrics and Gynecology

## 2016-02-15 ENCOUNTER — Encounter: Payer: Self-pay | Admitting: Obstetrics and Gynecology

## 2016-02-15 ENCOUNTER — Ambulatory Visit (INDEPENDENT_AMBULATORY_CARE_PROVIDER_SITE_OTHER): Payer: Medicare Other | Admitting: Obstetrics and Gynecology

## 2016-02-15 VITALS — BP 147/76 | HR 74 | Ht 65.0 in | Wt 161.7 lb

## 2016-02-15 DIAGNOSIS — Z4689 Encounter for fitting and adjustment of other specified devices: Secondary | ICD-10-CM

## 2016-02-15 DIAGNOSIS — N816 Rectocele: Secondary | ICD-10-CM | POA: Diagnosis not present

## 2016-02-15 DIAGNOSIS — N390 Urinary tract infection, site not specified: Secondary | ICD-10-CM

## 2016-02-15 NOTE — Progress Notes (Signed)
Chief complaint: 1. Pessary maintenance 2. Pelvic organ prolapse (cystocele, rectocele) 3. History of recurrent UTI  Six-week follow-up-pessary maintenance Last visit 01/04/2016 Gellhorn 2-1/4 inch pessary No significant discharge No odor No vaginal bleeding or pain Daughter assist with Premarin cream and triamcinolone gel Urology recommends no chronic prophylaxis for UTI due to inducing resistant strains of Escherichia coli; neurology recommends patient presenting when symptoms occur for straight catheter Specimen. Primary care recommends skilled nursing transfer from assisted living Patient has had recent evidence of head trauma with some deterioration in mental status, unclear events  OBJECTIVE: BP (!) 147/76   Pulse 74   Ht 5\' 5"  (1.651 m)   Wt 161 lb 11.2 oz (73.3 kg)   BMI 26.91 kg/m  Pleasant female in no acute distress. Obvious dementia findings appears, appears slightly more deteriorated HEENT: Frontal bruising above eyes Pelvic exam: External genitalia-normal BUS: Normal Vagina: No ulcerations; no malodor; minimal secretions in vault Cervix: Surgically absent Uterus: Surgically absent RV: Normal external exam; soiled perineum  Procedure: Pessary is removed, cleaned, reinserted  ASSESSMENT: 1. Pessary maintenance-normal 2. Dementia worsening 3. History of recurrent UTIs now off Macrobid suppression  PLAN: 1. Pessary is removed cleaned and reinserted 2. Continue with Trimosan gel alternating with Premarin cream every third day 3. Return in 6 weeks for recheck  A total of 15 minutes were spent face-to-face with the patient during this encounter and over half of that time dealt with counseling and coordination of care.  Brayton Mars, MD  Note: This dictation was prepared with Dragon dictation along with smaller phrase technology. Any transcriptional errors that result from this process are unintentional.

## 2016-02-15 NOTE — Patient Instructions (Signed)
1. Pessary is removed cleaned and reinserted 2. Continue with Trimosan gel alternating with Premarin cream every third day 3. Return in 6 weeks for recheck

## 2016-02-16 ENCOUNTER — Inpatient Hospital Stay: Payer: Medicare Other

## 2016-02-16 ENCOUNTER — Encounter: Payer: Self-pay | Admitting: Emergency Medicine

## 2016-02-16 ENCOUNTER — Emergency Department: Payer: Medicare Other

## 2016-02-16 ENCOUNTER — Inpatient Hospital Stay
Admission: EM | Admit: 2016-02-16 | Discharge: 2016-02-19 | DRG: 377 | Disposition: A | Discharge status: Home Health | Payer: Medicare Other | Attending: Internal Medicine | Admitting: Internal Medicine

## 2016-02-16 DIAGNOSIS — Z66 Do not resuscitate: Secondary | ICD-10-CM | POA: Diagnosis present

## 2016-02-16 DIAGNOSIS — W19XXXA Unspecified fall, initial encounter: Secondary | ICD-10-CM | POA: Diagnosis present

## 2016-02-16 DIAGNOSIS — R296 Repeated falls: Secondary | ICD-10-CM | POA: Diagnosis present

## 2016-02-16 DIAGNOSIS — G9340 Encephalopathy, unspecified: Secondary | ICD-10-CM | POA: Diagnosis present

## 2016-02-16 DIAGNOSIS — S060X0A Concussion without loss of consciousness, initial encounter: Secondary | ICD-10-CM | POA: Diagnosis present

## 2016-02-16 DIAGNOSIS — N952 Postmenopausal atrophic vaginitis: Secondary | ICD-10-CM

## 2016-02-16 DIAGNOSIS — Z7982 Long term (current) use of aspirin: Secondary | ICD-10-CM | POA: Diagnosis not present

## 2016-02-16 DIAGNOSIS — Z87440 Personal history of urinary (tract) infections: Secondary | ICD-10-CM | POA: Diagnosis not present

## 2016-02-16 DIAGNOSIS — Z823 Family history of stroke: Secondary | ICD-10-CM

## 2016-02-16 DIAGNOSIS — I10 Essential (primary) hypertension: Secondary | ICD-10-CM | POA: Diagnosis present

## 2016-02-16 DIAGNOSIS — K922 Gastrointestinal hemorrhage, unspecified: Secondary | ICD-10-CM | POA: Diagnosis present

## 2016-02-16 DIAGNOSIS — D509 Iron deficiency anemia, unspecified: Secondary | ICD-10-CM | POA: Diagnosis present

## 2016-02-16 DIAGNOSIS — Z801 Family history of malignant neoplasm of trachea, bronchus and lung: Secondary | ICD-10-CM

## 2016-02-16 DIAGNOSIS — G20 Parkinson's disease: Secondary | ICD-10-CM | POA: Diagnosis present

## 2016-02-16 DIAGNOSIS — R2681 Unsteadiness on feet: Secondary | ICD-10-CM

## 2016-02-16 DIAGNOSIS — Z8051 Family history of malignant neoplasm of kidney: Secondary | ICD-10-CM

## 2016-02-16 DIAGNOSIS — M79674 Pain in right toe(s): Secondary | ICD-10-CM | POA: Diagnosis present

## 2016-02-16 DIAGNOSIS — E860 Dehydration: Secondary | ICD-10-CM | POA: Diagnosis present

## 2016-02-16 DIAGNOSIS — G309 Alzheimer's disease, unspecified: Secondary | ICD-10-CM | POA: Diagnosis present

## 2016-02-16 DIAGNOSIS — N390 Urinary tract infection, site not specified: Secondary | ICD-10-CM

## 2016-02-16 DIAGNOSIS — Z86011 Personal history of benign neoplasm of the brain: Secondary | ICD-10-CM | POA: Diagnosis not present

## 2016-02-16 DIAGNOSIS — N3000 Acute cystitis without hematuria: Secondary | ICD-10-CM | POA: Diagnosis present

## 2016-02-16 DIAGNOSIS — R0602 Shortness of breath: Secondary | ICD-10-CM

## 2016-02-16 DIAGNOSIS — B9620 Unspecified Escherichia coli [E. coli] as the cause of diseases classified elsewhere: Secondary | ICD-10-CM | POA: Diagnosis present

## 2016-02-16 DIAGNOSIS — Z79899 Other long term (current) drug therapy: Secondary | ICD-10-CM | POA: Diagnosis not present

## 2016-02-16 DIAGNOSIS — E890 Postprocedural hypothyroidism: Secondary | ICD-10-CM | POA: Diagnosis present

## 2016-02-16 DIAGNOSIS — F0280 Dementia in other diseases classified elsewhere without behavioral disturbance: Secondary | ICD-10-CM | POA: Diagnosis present

## 2016-02-16 DIAGNOSIS — K921 Melena: Principal | ICD-10-CM | POA: Diagnosis present

## 2016-02-16 DIAGNOSIS — M6281 Muscle weakness (generalized): Secondary | ICD-10-CM

## 2016-02-16 DIAGNOSIS — R4182 Altered mental status, unspecified: Secondary | ICD-10-CM

## 2016-02-16 LAB — COMPREHENSIVE METABOLIC PANEL
ALBUMIN: 3.3 g/dL — AB (ref 3.5–5.0)
ALT: <5 U/L — ABNORMAL LOW (ref 14–54)
AST: 17 U/L (ref 15–41)
Alkaline Phosphatase: 66 U/L (ref 38–126)
Anion gap: 10 (ref 5–15)
BUN: 21 mg/dL — AB (ref 6–20)
CHLORIDE: 100 mmol/L — AB (ref 101–111)
CO2: 27 mmol/L (ref 22–32)
Calcium: 8.9 mg/dL (ref 8.9–10.3)
Creatinine, Ser: 1.36 mg/dL — ABNORMAL HIGH (ref 0.44–1.00)
GFR calc Af Amer: 39 mL/min — ABNORMAL LOW (ref >60)
GFR calc non Af Amer: 33 mL/min — ABNORMAL LOW (ref >60)
GLUCOSE: 152 mg/dL — AB (ref 65–99)
POTASSIUM: 3.6 mmol/L (ref 3.5–5.1)
Sodium: 137 mmol/L (ref 135–145)
Total Bilirubin: 0.4 mg/dL (ref 0.3–1.2)
Total Protein: 7.4 g/dL (ref 6.5–8.1)

## 2016-02-16 LAB — URINALYSIS COMPLETE WITH MICROSCOPIC (ARMC ONLY)
BILIRUBIN URINE: NEGATIVE
Glucose, UA: NEGATIVE mg/dL
Hgb urine dipstick: NEGATIVE
NITRITE: POSITIVE — AB
PH: 5 (ref 5.0–8.0)
Protein, ur: NEGATIVE mg/dL
SPECIFIC GRAVITY, URINE: 1.015 (ref 1.005–1.030)

## 2016-02-16 LAB — CBC WITH DIFFERENTIAL/PLATELET
BASOS ABS: 0.1 10*3/uL (ref 0–0.1)
BASOS PCT: 1 %
EOS PCT: 5 %
Eosinophils Absolute: 0.4 10*3/uL (ref 0–0.7)
HEMATOCRIT: 31.5 % — AB (ref 35.0–47.0)
Hemoglobin: 10.6 g/dL — ABNORMAL LOW (ref 12.0–16.0)
Lymphocytes Relative: 26 %
Lymphs Abs: 2.3 10*3/uL (ref 1.0–3.6)
MCH: 32.4 pg (ref 26.0–34.0)
MCHC: 33.7 g/dL (ref 32.0–36.0)
MCV: 96.2 fL (ref 80.0–100.0)
MONO ABS: 0.7 10*3/uL (ref 0.2–0.9)
Monocytes Relative: 8 %
NEUTROS ABS: 5.4 10*3/uL (ref 1.4–6.5)
Neutrophils Relative %: 60 %
PLATELETS: 332 10*3/uL (ref 150–440)
RBC: 3.28 MIL/uL — AB (ref 3.80–5.20)
RDW: 15.5 % — AB (ref 11.5–14.5)
WBC: 8.9 10*3/uL (ref 3.6–11.0)

## 2016-02-16 LAB — MRSA PCR SCREENING: MRSA by PCR: NEGATIVE

## 2016-02-16 LAB — TYPE AND SCREEN
ABO/RH(D): O POS
Antibody Screen: NEGATIVE

## 2016-02-16 LAB — HEMOGLOBIN: HEMOGLOBIN: 10.3 g/dL — AB (ref 12.0–16.0)

## 2016-02-16 LAB — LACTIC ACID, PLASMA: Lactic Acid, Venous: 0.9 mmol/L (ref 0.5–1.9)

## 2016-02-16 LAB — TROPONIN I: Troponin I: <0.03 ng/mL (ref <0.03)

## 2016-02-16 MED ORDER — SODIUM CHLORIDE 0.9 % IV BOLUS (SEPSIS)
500.0000 mL | Freq: Once | INTRAVENOUS | Status: AC
  Administered 2016-02-16: 500 mL via INTRAVENOUS

## 2016-02-16 MED ORDER — CEFTRIAXONE SODIUM IN DEXTROSE 20 MG/ML IV SOLN
1.0000 g | Freq: Once | INTRAVENOUS | Status: AC
  Administered 2016-02-16: 1 g via INTRAVENOUS
  Filled 2016-02-16: qty 50

## 2016-02-16 MED ORDER — POLYETHYLENE GLYCOL 3350 17 G PO PACK: 17.0000 g | PACK | Freq: Every day | ORAL | Status: DC | PRN

## 2016-02-16 MED ORDER — CITALOPRAM HYDROBROMIDE 20 MG PO TABS
20.0000 mg | ORAL_TABLET | ORAL | Status: DC
  Administered 2016-02-17 – 2016-02-19 (×3): 20 mg via ORAL
  Filled 2016-02-16 (×3): qty 1

## 2016-02-16 MED ORDER — LEVOTHYROXINE SODIUM 137 MCG PO TABS
137.0000 ug | ORAL_TABLET | Freq: Every day | ORAL | Status: DC
  Administered 2016-02-17 – 2016-02-18 (×2): 137 ug via ORAL
  Filled 2016-02-16 (×2): qty 1

## 2016-02-16 MED ORDER — CARBIDOPA-LEVODOPA ER 50-200 MG PO TBCR
1.0000 | EXTENDED_RELEASE_TABLET | Freq: Two times a day (BID) | ORAL | Status: DC
  Administered 2016-02-16 – 2016-02-19 (×6): 1 via ORAL
  Filled 2016-02-16 (×7): qty 1

## 2016-02-16 MED ORDER — ACETAMINOPHEN 325 MG PO TABS: 650.0000 mg | ORAL_TABLET | Freq: Four times a day (QID) | ORAL | Status: DC | PRN

## 2016-02-16 MED ORDER — ESTROGENS, CONJUGATED 0.625 MG/GM VA CREA
1.0000 | TOPICAL_CREAM | VAGINAL | Status: DC
Start: 2016-02-16 — End: 2016-02-19
  Administered 2016-02-16: 1 via VAGINAL
  Filled 2016-02-16: qty 30

## 2016-02-16 MED ORDER — PANTOPRAZOLE SODIUM 40 MG PO TBEC
40.0000 mg | DELAYED_RELEASE_TABLET | Freq: Two times a day (BID) | ORAL | Status: DC
  Administered 2016-02-16 – 2016-02-19 (×6): 40 mg via ORAL
  Filled 2016-02-16 (×6): qty 1

## 2016-02-16 MED ORDER — DONEPEZIL HCL 5 MG PO TABS
5.0000 mg | ORAL_TABLET | Freq: Every morning | ORAL | Status: DC
  Administered 2016-02-17 – 2016-02-19 (×3): 5 mg via ORAL
  Filled 2016-02-16 (×3): qty 1

## 2016-02-16 MED ORDER — ACETAMINOPHEN 650 MG RE SUPP
650.0000 mg | Freq: Four times a day (QID) | RECTAL | Status: DC | PRN
Start: 2016-02-16 — End: 2016-02-19

## 2016-02-16 MED ORDER — METOPROLOL TARTRATE 25 MG PO TABS
25.0000 mg | ORAL_TABLET | Freq: Two times a day (BID) | ORAL | Status: DC
  Administered 2016-02-16 – 2016-02-18 (×5): 25 mg via ORAL
  Filled 2016-02-16 (×6): qty 1

## 2016-02-16 MED ORDER — QUETIAPINE FUMARATE 25 MG PO TABS
50.0000 mg | ORAL_TABLET | Freq: Every day | ORAL | Status: DC
  Administered 2016-02-16 – 2016-02-18 (×3): 50 mg via ORAL
  Filled 2016-02-16 (×3): qty 2

## 2016-02-16 MED ORDER — MIRABEGRON ER 25 MG PO TB24
25.0000 mg | ORAL_TABLET | Freq: Every day | ORAL | Status: DC
  Administered 2016-02-17 – 2016-02-19 (×3): 25 mg via ORAL
  Filled 2016-02-16 (×3): qty 1

## 2016-02-16 MED ORDER — NYSTATIN 100000 UNIT/GM EX OINT
1.0000 "application " | TOPICAL_OINTMENT | Freq: Two times a day (BID) | CUTANEOUS | Status: DC | PRN
  Filled 2016-02-16: qty 15

## 2016-02-16 MED ORDER — VITAMIN D 1000 UNITS PO TABS
2000.0000 [IU] | ORAL_TABLET | Freq: Every day | ORAL | Status: DC
  Administered 2016-02-16 – 2016-02-19 (×4): 2000 [IU] via ORAL
  Filled 2016-02-16 (×4): qty 2

## 2016-02-16 MED ORDER — CEFTRIAXONE SODIUM IN DEXTROSE 20 MG/ML IV SOLN
1.0000 g | INTRAVENOUS | Status: DC
  Filled 2016-02-16: qty 50

## 2016-02-16 MED ORDER — POLYVINYL ALCOHOL 1.4 % OP SOLN
1.0000 [drp] | Freq: Four times a day (QID) | OPHTHALMIC | Status: DC | PRN
  Filled 2016-02-16: qty 15

## 2016-02-16 MED ORDER — SODIUM CHLORIDE 0.9 % IV SOLN
INTRAVENOUS | Status: DC
  Administered 2016-02-16: 19:00:00 via INTRAVENOUS

## 2016-02-16 MED ORDER — ALPRAZOLAM 0.5 MG PO TABS
0.2500 mg | ORAL_TABLET | Freq: Two times a day (BID) | ORAL | Status: DC | PRN
  Administered 2016-02-16 – 2016-02-18 (×4): 0.25 mg via ORAL
  Filled 2016-02-16 (×4): qty 1

## 2016-02-16 MED ORDER — PSYLLIUM 95 % PO PACK
1.0000 | PACK | Freq: Every day | ORAL | Status: DC
  Administered 2016-02-18 – 2016-02-19 (×2): 1 via ORAL
  Filled 2016-02-16 (×2): qty 1

## 2016-02-16 NOTE — ED Provider Notes (Signed)
[facility] Emergency Department Provider Note ____________________________________________   I have reviewed the triage vital signs and the triage nursing note.  HISTORY  Chief Complaint Fall   Historian Level 5 caveat patient with dementia, unable to provide own history Daughter provides history  HPI Tina Gilbert is a 82 y.o. female who lives at assisted living, has had several falls recently and daughter states that assisted living staff had suggested patient go to skilled nursing due to less ability to follow directions and be safe, increased falls.    About 2-3 weeks ago she had an apparent unwitnessed fall and developed a large amount of bruising to her forehead and then down below both eyes. She never had a CT scan for that. She has had some worsening confusion.  Does have a history of frequent uti per the daughter.    Past Medical History:  Diagnosis Date  . Acid reflux   . Alzheimer's dementia   . Anemia   . Anxiety   . Anxiety   . Arthritis   . Asymmetrical breasts   . Bladder irritability   . Cystocele    gellhorn 2 1/4   . DDD (degenerative disc disease), cervical   . Dementia   . GERD (gastroesophageal reflux disease)   . Hematuria   . History of UTI   . Hypertension   . Hypothyroidism   . Meningioma ([place])   . Osteoporosis   . Parkinson disease ([place])   . Parkinson's disease ([place])   . Pessary maintenance   . Rectocele    moderate  . Vaginal atrophy     Patient Active Problem List   Diagnosis Date Noted  . Atrophic vaginitis 09/25/2015  . UTI (lower urinary tract infection) 06/14/2015  . Cystocele 12/02/2014  . Rectocele 12/02/2014  . Abnormal brain scan 09/10/2013  . Difficulty walking 09/10/2013  . Amnesia 09/10/2013  . 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 Parkinson's disease ([place]) 09/10/2013  . Acid reflux 09/03/2013  . Benign essential HTN 09/03/2013  . Hyperlipidemia, unspecified 09/03/2013  .  Hypothyroidism 09/03/2013    Past Surgical History:  Procedure Laterality Date  . ABDOMINAL HYSTERECTOMY    . CARPAL TUNNEL RELEASE Bilateral   . COLON SURGERY    . COLONOSCOPY WITH PROPOFOL N/A 11/28/2014   Procedure: COLONOSCOPY WITH PROPOFOL;  Surgeon: Hulen Luster, MD;  Location: [facility] ENDOSCOPY;  Service: Gastroenterology;  Laterality: N/A;  . ESOPHAGOGASTRODUODENOSCOPY (EGD) WITH PROPOFOL N/A 11/28/2014   Procedure: ESOPHAGOGASTRODUODENOSCOPY (EGD) WITH PROPOFOL;  Surgeon: Hulen Luster, MD;  Location: [facility] ENDOSCOPY;  Service: Gastroenterology;  Laterality: N/A;  . OVARIAN MASS REMOVAL    . TOTAL THYROIDECTOMY      Prior to Admission medications   Medication Sig Start Date End Date Taking? Authorizing Provider  acetaminophen (TYLENOL) 650 MG CR tablet Take 650 mg by mouth 3 (three) times daily as needed for pain.   Yes Historical Provider, MD  ALPRAZolam (XANAX) 0.25 MG tablet Take 0.25 mg by mouth 2 (two) times daily as needed. For agitation. 03/29/15  Yes Historical Provider, MD  aspirin (GOODSENSE ASPIRIN) 81 MG chewable tablet Chew 81 mg by mouth daily.  10/11/15 10/10/16 Yes Historical Provider, MD  carbidopa-levodopa (SINEMET CR) 50-200 MG per tablet Take 1 tablet by mouth 2 (two) times daily.   Yes Historical Provider, MD  Carboxymeth-Glycerin-Polysorb (REFRESH OPTIVE ADVANCED) 0.5-1-0.5 % SOLN Apply 1 drop to eye 4 (four) times daily as needed. For constipation.   Yes Historical Provider, MD  Cholecalciferol (VITAMIN D3) 2000 units capsule Take 2,000 Units by mouth daily.   Yes Historical Provider, MD  citalopram (CELEXA) 20 MG tablet Take 20 mg by mouth every morning. Reported on 06/14/2015   Yes Historical Provider, MD  conjugated estrogens (PREMARIN) vaginal cream Place 1 Applicatorful vaginally daily. Insert 0.5mg  (pea-sized amount)  On Monday, Wednesday and Friday nights. 02/06/16  Yes Shannon A McGowan, PA-C  donepezil (ARICEPT) 5 MG tablet Take 5 mg by mouth every morning. 10/13/14  Yes  Historical Provider, MD  levothyroxine (SYNTHROID, LEVOTHROID) 137 MCG tablet Take 137 mcg by mouth daily before breakfast. *Take at least 30-60 minutes before breakfast*   Yes Historical Provider, MD  metoprolol tartrate (LOPRESSOR) 25 MG tablet Take 1 tablet (25 mg total) by mouth 2 (two) times daily. 06/19/15  Yes Dustin Flock, MD  mirabegron ER (MYRBETRIQ) 25 MG TB24 tablet Take 25 mg by mouth daily.  02/07/16  Yes Historical Provider, MD  nystatin ointment (MYCOSTATIN) Apply 1 application topically 2 (two) times daily as needed.    Yes Historical Provider, MD  omeprazole (PRILOSEC) 20 MG capsule Take 20 mg by mouth daily. Reported on 06/14/2015   Yes Historical Provider, MD  POLYETHYLENE GLYCOL 3350 PO Take 17 g by mouth daily as needed. For constipation. *Mix in 4-8 oz of fluid*   Yes Historical Provider, MD  QUEtiapine (SEROQUEL) 50 MG tablet Take 50 mg by mouth at bedtime.   Yes Historical Provider, MD  triamcinolone ointment (KENALOG) 0.1 % Apply 1 application topically 2 (two) times daily. Patient taking differently: Apply 1 application topically 2 (two) times daily as needed.  06/21/15  Yes Alanda Slim Defrancesco, MD  triamterene-hydrochlorothiazide (MAXZIDE-25) 37.5-25 MG tablet Take 0.5 tablets by mouth daily.  08/21/15  Yes Historical Provider, MD  TRIMO-SAN 0.025 % GEL USE AS DIRECTED Patient taking differently: Use vaginally as directed once a week on Wednesday. 01/30/15  Yes Alanda Slim Defrancesco, MD  Wheat Dextrin (BENEFIBER DRINK MIX PO) Take 1 Dose by mouth daily. Reported on 06/14/2015   Yes Historical Provider, MD    Allergies  Allergen Reactions  . Avelox [Moxifloxacin Hcl In Nacl] Other (See Comments)    Reaction:  Positive ANA and bilateral iritis   . Celebrex [Celecoxib] Other (See Comments)    Reaction:  Positive ANA and bilateral iritis   . Memantine Other (See Comments)    Confusion, AMS  . Memantine Hcl Other (See Comments)    confusion  . Oxybutynin Chloride Other (See  Comments)    Patient's daughter states she gets severe confusion.  . Tramadol Other (See Comments)    CONFUSION, FALLING Reaction:  Hallucinations     Family History  Problem Relation Age of Onset  . CAD    . Kidney cancer Father   . Bladder Cancer Neg Hx   . Prostate cancer Neg Hx     Social History Social History  Substance Use Topics  . Smoking status: Never Smoker  . Smokeless tobacco: Never Used  . Alcohol use No    Review of Systems Limited due to dementia, but these are her answers Constitutional: Negative for fever. Eyes: Negative for visual changes. ENT: Negative for sore throat. Cardiovascular: Negative for chest pain. Respiratory: Negative for shortness of breath. Gastrointestinal: Negative for abdominal pain, vomiting and diarrhea. Genitourinary: Negative for dysuria. Musculoskeletal: Negative for back pain. Skin: Negative for rash. Neurological: Negative for headache. 10 point Review of Systems otherwise negative ____________________________________________   PHYSICAL EXAM:  VITAL SIGNS: ED  Triage Vitals  Enc Vitals Group     BP 02/16/16 1411 (!) 142/65     Pulse Rate 02/16/16 1411 82     Resp 02/16/16 1411 20     Temp 02/16/16 1411 (!) 96.8 F (36 C)     Temp Source 02/16/16 1411 Oral     SpO2 02/16/16 1411 100 %     Weight 02/16/16 1412 161 lb (73 kg)     Height --      Head Circumference --      Peak Flow --      Pain Score --      Pain Loc --      Pain Edu? --      Excl. in [place]? --      Constitutional: Alert and Talkative but not oriented. In no acute distress. HEENT   Head: Old bruising to forehead      Eyes: Conjunctivae are normal. PERRL. Normal extraocular movements.      Ears:         Nose: No congestion/rhinnorhea.   Mouth/Throat: Mucous membranes are moist.   Neck: No stridor. Cardiovascular/Chest: Normal rate, regular rhythm.  No murmurs, rubs, or gallops. Respiratory: Normal respiratory effort without tachypnea  nor retractions. Breath sounds are clear and equal bilaterally. No wheezes/rales/rhonchi. Gastrointestinal: Soft. No distention, no guarding, no rebound. Nontender.    Genitourinary/rectal:Deferred Musculoskeletal: Nontender with normal range of motion in all extremities. No joint effusions.  No lower extremity tenderness.  No edema. Neurologic:  No facial droop.  No slurred speech. No gross or focal neurologic deficits are appreciated. Skin:  Skin is warm, dry and intact. No rash noted. Psychiatric:  Needs frequent redirection.   ____________________________________________  LABS (pertinent positives/negatives)  Labs Reviewed  CBC WITH DIFFERENTIAL/PLATELET - Abnormal; Notable for the following:       Result Value   RBC 3.28 (*)    Hemoglobin 10.6 (*)    HCT 31.5 (*)    RDW 15.5 (*)    All other components within normal limits  COMPREHENSIVE METABOLIC PANEL - Abnormal; Notable for the following:    Chloride 100 (*)    Glucose, Bld 152 (*)    BUN 21 (*)    Creatinine, Ser 1.36 (*)    Albumin 3.3 (*)    ALT <5 (*)    GFR calc non Af Amer 33 (*)    GFR calc Af Amer 39 (*)    All other components within normal limits  URINALYSIS COMPLETEWITH MICROSCOPIC ([facility] ONLY) - Abnormal; Notable for the following:    Color, Urine YELLOW (*)    APPearance CLOUDY (*)    Ketones, ur TRACE (*)    Nitrite POSITIVE (*)    Leukocytes, UA 3+ (*)    Bacteria, UA MANY (*)    Squamous Epithelial / LPF 0-5 (*)    All other components within normal limits  URINE CULTURE  CULTURE, BLOOD (ROUTINE X 2)  CULTURE, BLOOD (ROUTINE X 2)  TROPONIN I  LACTIC ACID, PLASMA  LACTIC ACID, PLASMA  TYPE AND SCREEN    ____________________________________________    EKG I, Lisa Roca, MD, the attending physician have personally viewed and interpreted all ECGs.  None ____________________________________________  RADIOLOGY All Xrays were viewed by me. Imaging interpreted by Radiologist.  Ct head  without contrast:  IMPRESSION: No acute finding or change compared to prior. __________________________________________  PROCEDURES  Procedure(s) performed: None  Critical Care performed: None  ____________________________________________   ED COURSE / ASSESSMENT AND  PLAN  Pertinent labs & imaging results that were available during my care of the patient were reviewed by me and considered in my medical decision making (see chart for details).    Ms. Kreft is here with some worsening confusion and what sounds like maybe 2 or 3 weeks, she did have an unwitnessed fall a few weeks ago and her CT scan today does not show an acute intracranial injury.  She does however have an acute urinary tract infection and taken this with her altered mental status per her baseline, I'm not sure how much of this is due to the urinary tract infection versus the dementia. Any case, with multiple falls and acute UTI with reported mental status changes at the it's reasonable to treat and observe in the hospital and then also have physical therapy evaluate for possibility of skilled nursing placement.    CONSULTATIONS:  Hospitalist for admission.   Patient / Family / Caregiver informed of clinical course, medical decision-making process, and agree with plan.   ___________________________________________   FINAL CLINICAL IMPRESSION(S) / ED DIAGNOSES   Final diagnoses:  Urinary tract infection without hematuria, site unspecified  Altered mental status, unspecified altered mental status type              Note: This dictation was prepared with Dragon dictation. Any transcriptional errors that result from this process are unintentional    Lisa Roca, MD 02/16/16 1824

## 2016-02-16 NOTE — ED Triage Notes (Signed)
Pt brought in by family from a nursing facility for a possible fall. Family reports pt gait is unsteady, shortness of breath. Daughter showing me a picture of pt's face last week after fall. Facial bruising noted in the picture. Pt with dementia.

## 2016-02-16 NOTE — H&P (Addendum)
[name] NAME: Tina Gilbert    MR#:  [id number]  DATE OF BIRTH:  [birth year]  DATE OF ADMISSION:  02/16/2016  PRIMARY CARE PHYSICIAN: BABAOFF, Caryl Bis, MD   REQUESTING/REFERRING PHYSICIAN: Dr Lisa Roca  CHIEF COMPLAINT:   Brought in with worsening mental status, black stools.  HISTORY OF PRESENT ILLNESS:  Tina Gilbert  is a 82 y.o. female with a known history of Dementia. The patient is a poor historian and is unable to give any history. Daughter at the bedside and able to give history. On September 29, [facility] called the daughter that the patient had long blue lines across her forehead and around her eye. The daughter came to visit the next day and she was black and blue around the eyes and both arms. Apparently the patient had an unwitnessed fall. Since that time the patient has had altered mental status and declined rapidly. She can hardly talk in complete sentences. At time she doesn't know her family. A few months ago she was started on Seroquel to sleep at night. She has been sleeping better since she has been placed on that. She's had frequent urinary tract infections and has a pessary for cystocele and rectocele. Family has noticed dark black stools. In the ER she was found to have a urinary tract infection, worsening mental status and guaiac positive stool.  PAST MEDICAL HISTORY:   Past Medical History:  Diagnosis Date  . Acid reflux   . Alzheimer's dementia   . Anemia   . Anxiety   . Anxiety   . Arthritis   . Asymmetrical breasts   . Bladder irritability   . Cystocele    gellhorn 2 1/4   . DDD (degenerative disc disease), cervical   . Dementia   . GERD (gastroesophageal reflux disease)   . Hematuria   . History of UTI   . Hypertension   . Hypothyroidism   . Meningioma ([place])   . Osteoporosis   . Parkinson disease ([place])   . Parkinson's disease ([place])   . Pessary maintenance   .  Rectocele    moderate  . Vaginal atrophy     PAST SURGICAL HISTORY:   Past Surgical History:  Procedure Laterality Date  . ABDOMINAL HYSTERECTOMY    . CARPAL TUNNEL RELEASE Bilateral   . COLON SURGERY    . COLONOSCOPY WITH PROPOFOL N/A 11/28/2014   Procedure: COLONOSCOPY WITH PROPOFOL;  Surgeon: Hulen Luster, MD;  Location: [facility] ENDOSCOPY;  Service: Gastroenterology;  Laterality: N/A;  . ESOPHAGOGASTRODUODENOSCOPY (EGD) WITH PROPOFOL N/A 11/28/2014   Procedure: ESOPHAGOGASTRODUODENOSCOPY (EGD) WITH PROPOFOL;  Surgeon: Hulen Luster, MD;  Location: [facility] ENDOSCOPY;  Service: Gastroenterology;  Laterality: N/A;  . OVARIAN MASS REMOVAL    . TOTAL THYROIDECTOMY      SOCIAL HISTORY:   Social History  Substance Use Topics  . Smoking status: Never Smoker  . Smokeless tobacco: Never Used  . Alcohol use No    FAMILY HISTORY:   Family History  Problem Relation Age of Onset  . CAD    . Kidney cancer Father   . CVA Mother   . CVA Sister   . Lung cancer Brother   . Bladder Cancer Neg Hx   . Prostate cancer Neg Hx     DRUG ALLERGIES:   Allergies  Allergen Reactions  . Avelox [Moxifloxacin Hcl In Nacl] Other (See Comments)    Reaction:  Positive ANA and  bilateral iritis   . Celebrex [Celecoxib] Other (See Comments)    Reaction:  Positive ANA and bilateral iritis   . Memantine Other (See Comments)    Confusion, AMS  . Memantine Hcl Other (See Comments)    confusion  . Oxybutynin Chloride Other (See Comments)    Patient's daughter states she gets severe confusion.  . Tramadol Other (See Comments)    CONFUSION, FALLING Reaction:  Hallucinations     REVIEW OF SYSTEMS:  Limited secondary to dementia   CONSTITUTIONAL: No fever.  EYES: Drops for dry eye  EARS, NOSE, AND THROAT: No sore throat RESPIRATORY: Some shortness of breath. CARDIOVASCULAR: No chest pain.  GASTROINTESTINAL: No nausea, vomiting, or abdominal pain. Black stools. GENITOURINARY: No dysuria. ENDOCRINE:  Positive for hypothyroidism HEMATOLOGY: History of anemia, positive for easy bruising. SKIN: Positive for bruising over the forehead MUSCULOSKELETAL: No joint pain.   PSYCHIATRY: On medication for anxiety and sleep  MEDICATIONS AT HOME:   Prior to Admission medications   Medication Sig Start Date End Date Taking? Authorizing Provider  acetaminophen (TYLENOL) 650 MG CR tablet Take 650 mg by mouth 3 (three) times daily as needed for pain.   Yes Historical Provider, MD  ALPRAZolam (XANAX) 0.25 MG tablet Take 0.25 mg by mouth 2 (two) times daily as needed. For agitation. 03/29/15  Yes Historical Provider, MD  aspirin (GOODSENSE ASPIRIN) 81 MG chewable tablet Chew 81 mg by mouth daily.  10/11/15 10/10/16 Yes Historical Provider, MD  carbidopa-levodopa (SINEMET CR) 50-200 MG per tablet Take 1 tablet by mouth 2 (two) times daily.   Yes Historical Provider, MD  Carboxymeth-Glycerin-Polysorb (REFRESH OPTIVE ADVANCED) 0.5-1-0.5 % SOLN Apply 1 drop to eye 4 (four) times daily as needed. For constipation.   Yes Historical Provider, MD  Cholecalciferol (VITAMIN D3) 2000 units capsule Take 2,000 Units by mouth daily.   Yes Historical Provider, MD  citalopram (CELEXA) 20 MG tablet Take 20 mg by mouth every morning. Reported on 06/14/2015   Yes Historical Provider, MD  conjugated estrogens (PREMARIN) vaginal cream Place 1 Applicatorful vaginally daily. Insert 0.5mg  (pea-sized amount)  On Monday, Wednesday and Friday nights. 02/06/16  Yes Shannon A McGowan, PA-C  donepezil (ARICEPT) 5 MG tablet Take 5 mg by mouth every morning. 10/13/14  Yes Historical Provider, MD  levothyroxine (SYNTHROID, LEVOTHROID) 137 MCG tablet Take 137 mcg by mouth daily before breakfast. *Take at least 30-60 minutes before breakfast*   Yes Historical Provider, MD  metoprolol tartrate (LOPRESSOR) 25 MG tablet Take 1 tablet (25 mg total) by mouth 2 (two) times daily. 06/19/15  Yes Dustin Flock, MD  mirabegron ER (MYRBETRIQ) 25 MG TB24 tablet Take  25 mg by mouth daily.  02/07/16  Yes Historical Provider, MD  nystatin ointment (MYCOSTATIN) Apply 1 application topically 2 (two) times daily as needed.    Yes Historical Provider, MD  omeprazole (PRILOSEC) 20 MG capsule Take 20 mg by mouth daily. Reported on 06/14/2015   Yes Historical Provider, MD  POLYETHYLENE GLYCOL 3350 PO Take 17 g by mouth daily as needed. For constipation. *Mix in 4-8 oz of fluid*   Yes Historical Provider, MD  QUEtiapine (SEROQUEL) 50 MG tablet Take 50 mg by mouth at bedtime.   Yes Historical Provider, MD  triamcinolone ointment (KENALOG) 0.1 % Apply 1 application topically 2 (two) times daily. Patient taking differently: Apply 1 application topically 2 (two) times daily as needed.  06/21/15  Yes Alanda Slim Defrancesco, MD  triamterene-hydrochlorothiazide (MAXZIDE-25) 37.5-25 MG tablet Take 0.5 tablets by  mouth daily.  08/21/15  Yes Historical Provider, MD  TRIMO-SAN 0.025 % GEL USE AS DIRECTED Patient taking differently: Use vaginally as directed once a week on Wednesday. 01/30/15  Yes Alanda Slim Defrancesco, MD  Wheat Dextrin (BENEFIBER DRINK MIX PO) Take 1 Dose by mouth daily. Reported on 06/14/2015   Yes Historical Provider, MD      VITAL SIGNS:  Blood pressure (!) 125/53, pulse 74, temperature (!) 96.8 F (36 C), temperature source Oral, resp. rate 20, weight 73 kg (161 lb), SpO2 98 %.  PHYSICAL EXAMINATION:  GENERAL:  82 y.o.-year-old patient lying in the bed with no acute distress.  EYES: Pupils equal, round, reactive to light and accommodation. No scleral icterus. HEENT: Head atraumatic, normocephalic. Oropharynx and nasopharynx clear.  NECK:  Supple, no jugular venous distention. No thyroid enlargement, no tenderness.  LUNGS: Decreased breath sounds bilaterally, no wheezing, rales,rhonchi or crepitation. No use of accessory muscles of respiration.  CARDIOVASCULAR: S1, S2 normal. No murmurs, rubs, or gallops.  ABDOMEN: Soft, nontender, nondistended. Bowel sounds  present. No organomegaly or mass.  EXTREMITIES: Trace edema, no cyanosis, or clubbing.  NEUROLOGIC: Patient moves all extremities on room Gait not checked.  PSYCHIATRIC: The patient is alert.  SKIN: Raymond bruising seen over the  forehead  LABORATORY PANEL:   CBC  Recent Labs Lab 02/16/16 1445  WBC 8.9  HGB 10.6*  HCT 31.5*  PLT 332   ------------------------------------------------------------------------------------------------------------------  Chemistries   Recent Labs Lab 02/16/16 1445  NA 137  K 3.6  CL 100*  CO2 27  GLUCOSE 152*  BUN 21*  CREATININE 1.36*  CALCIUM 8.9  AST 17  ALT <5*  ALKPHOS 66  BILITOT 0.4   ------------------------------------------------------------------------------------------------------------------  Cardiac Enzymes  Recent Labs Lab 02/16/16 1445  TROPONINI <0.03   ------------------------------------------------------------------------------------------------------------------  RADIOLOGY:  Ct Head Wo Contrast  Result Date: 02/16/2016 CLINICAL DATA:  Head injury several weeks ago with altered mental status EXAM: CT HEAD WITHOUT CONTRAST TECHNIQUE: Contiguous axial images were obtained from the base of the skull through the vertex without intravenous contrast. COMPARISON:  11/19/2015 FINDINGS: Brain: No evidence of acute infarction, hemorrhage, hydrocephalus, extra-axial collection or shift. There is a calcified right posterior frontal meningioma measuring 11 mm, stable over multiple years. Cerebral volume loss, moderate in the mesial temporal lobes (implicating Alzheimer's in this patient with dementia history). Moderate chronic microvascular disease with confluent ischemic gliosis. Vascular: No hyperdense vessel or unexpected calcification. Skull: Normal. Negative for fracture or focal lesion. Sinuses/Orbits: No acute finding. Cataract resection seen on the left. IMPRESSION: No acute finding or change compared to prior.  Electronically Signed   By: Monte Fantasia M.D.   On: 02/16/2016 16:01     IMPRESSION AND PLAN:   1. Melena with GI bleed. Serial hemoglobins. Stop aspirin. Protonix 40 mg by mouth twice a day. Family would like to avoid procedures if possible. 2. Acute encephalopathy with worsening mental status since head trauma. Possible concussion. CT scan of the head does not show anything acute. Could also be worse with acute cystitis and dehydration. 3. Acute cystitis without hematuria. Rocephin ordered. Urine culture sent off. 4. Dehydration we'll give IV fluid hydration overnight recheck creatinine tomorrow. 5. Dementia without behavioral disturbance. Seroquel at night. Aricept. 6. Hypothyroidism unspecified continue levothyroxine. Check a TSH in the morning. 7. Essential hypertension continue metoprolol. Hold Dyazide. 8. Weakness. Physical therapy evaluation 9. Her assisted living facility recommended higher level of care. We'll get [employer], [employer]. 10. Shortness of breath. Start with a  chest x-ray.  All the records are reviewed and case discussed with ED provider. Management plans discussed with the patient, family and they are in agreement.  CODE STATUS: DO NOT RESUSCITATE  TOTAL TIME TAKING CARE OF THIS PATIENT: 50 minutes.    Loletha Grayer M.D on 02/16/2016 at 6:38 PM  Between 7am to 6pm - Pager - [phone]  After 6pm call admission pager [phone]  [employer] Office  [phone]  CC: Primary care physician; BABAOFF, Caryl Bis, MD

## 2016-02-16 NOTE — ED Notes (Signed)
Positive hemoccult.  

## 2016-02-17 LAB — BASIC METABOLIC PANEL
Anion gap: 7 (ref 5–15)
BUN: 18 mg/dL (ref 6–20)
CALCIUM: 8.4 mg/dL — AB (ref 8.9–10.3)
CO2: 27 mmol/L (ref 22–32)
CREATININE: 0.97 mg/dL (ref 0.44–1.00)
Chloride: 104 mmol/L (ref 101–111)
GFR calc Af Amer: 58 mL/min — ABNORMAL LOW (ref >60)
GFR, EST NON AFRICAN AMERICAN: 50 mL/min — AB (ref >60)
GLUCOSE: 98 mg/dL (ref 65–99)
POTASSIUM: 3.6 mmol/L (ref 3.5–5.1)
SODIUM: 138 mmol/L (ref 135–145)

## 2016-02-17 LAB — CBC
HEMATOCRIT: 29.1 % — AB (ref 35.0–47.0)
Hemoglobin: 9.4 g/dL — ABNORMAL LOW (ref 12.0–16.0)
MCH: 31.7 pg (ref 26.0–34.0)
MCHC: 32.5 g/dL (ref 32.0–36.0)
MCV: 97.6 fL (ref 80.0–100.0)
PLATELETS: 287 10*3/uL (ref 150–440)
RBC: 2.98 MIL/uL — ABNORMAL LOW (ref 3.80–5.20)
RDW: 15.6 % — AB (ref 11.5–14.5)
WBC: 7.5 10*3/uL (ref 3.6–11.0)

## 2016-02-17 LAB — FERRITIN: FERRITIN: 90 ng/mL (ref 11–307)

## 2016-02-17 LAB — TSH: TSH: 10.309 u[IU]/mL — ABNORMAL HIGH (ref 0.350–4.500)

## 2016-02-17 MED ORDER — CEFTRIAXONE SODIUM-DEXTROSE 1-3.74 GM-% IV SOLR
1.0000 g | INTRAVENOUS | Status: DC
  Administered 2016-02-17 – 2016-02-18 (×2): 1 g via INTRAVENOUS
  Filled 2016-02-17 (×3): qty 50

## 2016-02-17 NOTE — Evaluation (Signed)
Physical Therapy Evaluation Patient Details Name: ZAYLIANA PARADY MRN: [id number] DOB: Dec 26, 1924 Today's Date: 02/17/2016   History of Present Illness  Dametria Gressman is an 82yo white female who comes to [facility] on10/13 with AMS and new bruising to BUE and face, suspected to have sustained an unwitnessed fall at her facility. Upon admission noted to have melena c GIB and UTI. At baseline she lives at ALF, performing household distances without AD (refuses AD) at supervision level and has had more falls in the last few months. Last three Hb: 10.6 -->10.3-->9.4. PMH includes dementia, PD.   Clinical Impression  Upon entry, the patient is received semirecumbent in bed, 2 daughters present. The pt is awake and agreeable to participate. No acute distress noted at this time. The pt is alert, oriented to self, recognizes her daughters, pleasant, and following simple commands 50-75% of time, with some mild-moderate attention deficit. Pt received on and remaining on 2L O2, and moved to RA throughout evaluation, SaO2 >89%. Functional mobility assessment demonstrates mild weakness in legs/trunk which is mildly impaired compared to baseline, and some mild gait ataxia which daughters endorse as acute compared to baseline gait. The patient is at high risk for falls as evidence by gait speed <1.34m/s, forward reach <5", and multiple instances of instability demonstrated throughout session.   Patient presenting with impairment of strength, range of motion, balance, and activity tolerance, limiting ability to perform simply mobility tasks at baseline level of safety and function. Patient will benefit from skilled intervention to address the above impairments and limitations, in order to restore to prior level of function, improve patient safety upon discharge, and to decrease falls risk.       Follow Up Recommendations Home health PT;Supervision for mobility/OOB    Equipment Recommendations  None recommended by PT     Recommendations for Other Services       Precautions / Restrictions Precautions Precautions: None Restrictions Weight Bearing Restrictions: No      Mobility  Bed Mobility Overal bed mobility: Needs Assistance Bed Mobility: Supine to Sit;Sit to Supine     Supine to sit: Supervision Sit to supine: Supervision   General bed mobility comments: addtional time/effor tfor weakness, some VC required for mild apraxia.   Transfers Overall transfer level: Needs assistance Equipment used: None Transfers: Sit to/from Stand Sit to Stand: Supervision            Ambulation/Gait Ambulation/Gait assistance: Min guard Ambulation Distance (Feet): 160 Feet Assistive device: 1 person hand held assist Gait Pattern/deviations: Trunk flexed   Gait velocity interpretation: <1.8 ft/sec, indicative of risk for recurrent falls General Gait Details: slow, increased staggering right/left which daughter says is not baseline. Somewhat impaired dynamic stability.   Stairs            Wheelchair Mobility    Modified Rankin (Stroke Patients Only)       Balance Overall balance assessment: Needs assistance Sitting-balance support: Single extremity supported       Standing balance support: Single extremity supported Standing balance-Leahy Scale: Fair                               Pertinent Vitals/Pain Pain Assessment: No/denies pain    Home Living Family/patient expects to be discharged to:: Assisted living               Home Equipment: None (refuses AD despite recommendations.)      Prior Function Level of  Independence: Needs assistance   Gait / Transfers Assistance Needed: 1 person hand held assistance  ADL's / Homemaking Assistance Needed: staff assists with ADLs  Comments: Pt lives at [facility] and has assistance for all ADLs and mobility due to cognitive status     Hand Dominance        Extremity/Trunk Assessment                Lower Extremity Assessment: Generalized weakness;Overall WFL for tasks assessed         Communication      Cognition Arousal/Alertness: Awake/alert Behavior During Therapy: WFL for tasks assessed/performed Overall Cognitive Status:  (follows simple cues 75% with repitition; easily distractible. )       Memory: Decreased recall of precautions;Decreased short-term memory              General Comments      Exercises     Assessment/Plan    PT Assessment Patient needs continued PT services  PT Problem List Decreased balance;Decreased mobility;Decreased coordination;Decreased safety awareness          PT Treatment Interventions Gait training;Functional mobility training;Therapeutic activities;Balance training;Patient/family education;Therapeutic exercise    PT Goals (Current goals can be found in the Care Plan section)  Acute Rehab PT Goals Patient Stated Goal: Restore quality of gait to PLOF, maintain strenght, reduce falls risk  PT Goal Formulation: With family Time For Goal Achievement: 03/02/16 Potential to Achieve Goals: Fair    Frequency Min 2X/week   Barriers to discharge        Co-evaluation               End of Session Equipment Utilized During Treatment: Gait belt Activity Tolerance: Patient tolerated treatment well Patient left: in bed;with SCD's reapplied;with family/visitor present;with bed alarm set Nurse Communication: Other (comment) (Pt stable on room air. )         Time: [id number] PT Time Calculation (min) (ACUTE ONLY): 33 min   Charges:   PT Evaluation $PT Eval Moderate Complexity: 1 Procedure PT Treatments $Therapeutic Activity: 8-22 mins   PT G Codes:        12:30 PM, 2016-02-26 Etta Grandchild, PT, DPT Physical Therapist - [place] [phone] (mobile)

## 2016-02-17 NOTE — Clinical Social Work Note (Signed)
Clinical Social Work Assessment  Patient Details  Name: Tina Gilbert MRN: [id number] Date of Birth: [birth year]  Date of referral:  02/17/16               Reason for consult:  Facility Placement                Permission sought to share information with:  Chartered certified accountant granted to share information::  Yes, Verbal Permission Granted  Name::        Agency::  [facility] Tina Gilbert) and [facility] Tina Gilbert)  Relationship::     Contact Information:     Housing/Transportation Living arrangements for the past 2 months:  [place] of Information:  Adult Children, Facility Patient Interpreter Needed:  None Criminal Activity/Legal Involvement Pertinent to Current Situation/Hospitalization:  No - Comment as needed Significant Relationships:  Adult Children, Warehouse manager, Other Family Members Lives with:  Facility Resident Do you feel safe going back to the place where you live?  Yes Need for family participation in patient care:  Yes (Comment)  Care giving concerns:  Change in needs from ALF to LTC SNF placement.   Social Worker assessment / plan:  The patient was comfortable in bed and being fed by her daughter, Tina Gilbert when Tina Gilbert visited bedside. Tina Gilbert asked the CSW to speak with patient's daughter Tina Gilbert via telephone as Tina Gilbert is the primary caregiver. CSW contacted Tina Gilbert to discuss dc planning.  Tina Gilbert reported that she does not want her mother to go to a locked MC[facility] due to quality of life issues. However, she does recognize that her mother's dementia and Parkinson's are in decline and that her mother will need a higher level of care than [facility] can provide. The daughter indicated that [facility] would be her choice for LTC, and that she is in the process of filing Medicaid for payment. CSW DID NOT mention LOG as an option; however, that may be beneficial in the dc process.  Tina Gilbert at [place] is aware of PT recs,  and she reported that she would need to assess the patient prior to accepting her back due to escalating behaviors associated with dementia (multiple falls, aggression at times).   CSW has begun bed search for LTC SNF with verbal permission from the daughter. The patient is dependent in bathing, feeding, and dressing. The patient ambulates with no DME. The patient is prone to UTIs and is becoming resistant to microbid as an antibiotic due to frequency of UTIs.  Employment status:  Retired Nurse, adult PT Recommendations:  Home with [place] / Referral to community resources:  [place]  Patient/Family's Response to care: The patient's daughter is worried for her mother and recognizes that her mother will need a higher level of care. The patient thanked the CSW for assistance.  Patient/Family's Understanding of and Emotional Response to Diagnosis, Current Treatment, and Prognosis:  The patient's daughter is very health literate and able to understand the issues at hand. The patient's daughter is the primary caregiver.  Emotional Assessment Appearance:  Appears younger than stated age Attitude/Demeanor/Rapport:  Unresponsive Affect (typically observed):  Calm Orientation:   (Patient has dementia) Alcohol / Substance use:  Never Used Psych involvement (Current and /or in the community):  No (Comment)  Discharge Needs  Concerns to be addressed:  Cognitive Concerns, Discharge Planning Concerns Readmission within the last 30 days:  No Current discharge risk:  Chronically ill Barriers to Discharge:  Continued  Medical Work up   [employer], LCSW 02/17/2016, 3:10 PM

## 2016-02-17 NOTE — Plan of Care (Signed)
Problem: Education: Goal: Knowledge of [place] [facility] Education information/materials will improve Outcome: Not Progressing Patient has dementia.  Problem: Health Behavior/Discharge Planning: Goal: Ability to manage health-related needs will improve Outcome: Not Progressing Patient has dementia.   

## 2016-02-17 NOTE — Care Management Note (Signed)
Case Management Note  Patient Details  Name: Tina Gilbert MRN: [id number] Date of Birth: [birth year]  Subjective/Objective:     Tina Gilbert is a resident at [facility] ([place] Hx Alzheimers. Admitted per GI bleed and acute cystitis. [facility] has recommended a high level of care to the family, however [facility]-PT is recommending home with home health. CSW is following for appropriate placement.                 Action/Plan:   Expected Discharge Date:                  Expected Discharge Plan:     In-House Referral:     Discharge planning Services     Post Acute Care Choice:    Choice offered to:     DME Arranged:    DME Agency:     HH Arranged:    HH Agency:     Status of Service:     If discussed at [employer] of Stay Meetings, dates discussed:    Additional Comments:  Myquan Schaumburg A, RN 02/17/2016, 2:57 PM

## 2016-02-17 NOTE — Progress Notes (Signed)
Patient ID: Tina Gilbert, female   DOB: 02-10-1925, 82 y.o.   MRN: [id number]  [facility] PROGRESS NOTE  Tina Gilbert [id number] DOB: 1924/10/21 DOA: 02/16/2016 PCP: Marcello Fennel, MD  HPI/Subjective: Patient's only smiling. Offers no complaints. Answer she feels fine to most questions.  Objective: Vitals:   02/17/16 1105 02/17/16 1137  BP: (!) 133/51   Pulse: 75 77  Resp:    Temp:      Filed Weights   02/16/16 1412  Weight: 73 kg (161 lb)    ROS: Review of Systems  Unable to perform ROS: Dementia  Respiratory: Positive for shortness of breath.   Cardiovascular: Negative for chest pain.  Gastrointestinal: Negative for abdominal pain, nausea and vomiting.   Limited review of systems with dementia  Exam: Physical Exam  HENT:  Nose: No mucosal edema.  Mouth/Throat: No oropharyngeal exudate or posterior oropharyngeal edema.  Eyes: Conjunctivae and lids are normal. Pupils are equal, round, and reactive to light.  Neck: No JVD present. Carotid bruit is not present. No edema present. No thyroid mass and no thyromegaly present.  Cardiovascular: S1 normal and S2 normal.  Exam reveals no gallop.   Murmur heard.  Systolic murmur is present with a grade of 2/6  Pulses:      Dorsalis pedis pulses are 2+ on the right side, and 2+ on the left side.  Respiratory: No respiratory distress. She has no wheezes. She has no rhonchi. She has no rales.  GI: Soft. Bowel sounds are normal. There is no tenderness.  Musculoskeletal:       Right ankle: She exhibits swelling.       Left ankle: She exhibits swelling.  Lymphadenopathy:    She has no cervical adenopathy.  Neurological: She is alert.  Skin: Skin is warm. No rash noted. Nails show no clubbing.  Psychiatric: She has a normal mood and affect.      Data Reviewed: Basic Metabolic Panel:  Recent Labs Lab 02/16/16 1445 02/17/16 0353  NA 137 138  K 3.6 3.6  CL 100* 104  CO2 27 27  GLUCOSE 152* 98  BUN 21*  18  CREATININE 1.36* 0.97  CALCIUM 8.9 8.4*   Liver Function Tests:  Recent Labs Lab 02/16/16 1445  AST 17  ALT <5*  ALKPHOS 66  BILITOT 0.4  PROT 7.4  ALBUMIN 3.3*    CBC:  Recent Labs Lab 02/16/16 1445 02/16/16 2058 02/17/16 0353  WBC 8.9  --  7.5  NEUTROABS 5.4  --   --   HGB 10.6* 10.3* 9.4*  HCT 31.5*  --  29.1*  MCV 96.2  --  97.6  PLT 332  --  287   Cardiac Enzymes:  Recent Labs Lab 02/16/16 1445  TROPONINI <0.03     Recent Results (from the past 240 hour(s))  MRSA PCR Screening     Status: None   Collection Time: 02/16/16  8:24 PM  Result Value Ref Range Status   MRSA by PCR NEGATIVE NEGATIVE Final    Comment:        The GeneXpert MRSA Assay (FDA approved for NASAL specimens only), is one component of a comprehensive MRSA colonization surveillance program. It is not intended to diagnose MRSA infection nor to guide or monitor treatment for MRSA infections.      Studies: Dg Chest 1 View  Result Date: 02/16/2016 CLINICAL DATA:  Acute onset of shortness of breath, with worsening mental status. Initial encounter. EXAM: CHEST 1 VIEW COMPARISON:  Chest radiograph performed 06/15/2015, and CT of the chest performed 06/18/2015 FINDINGS: The lungs are well-aerated. Mild vascular congestion is noted. Mild bibasilar opacities may reflect mild interstitial edema or infection. There is no evidence of pleural effusion or pneumothorax. The cardiomediastinal silhouette is borderline enlarged. No acute osseous abnormalities are seen. IMPRESSION: Mild vascular congestion and borderline cardiomegaly. Mild bibasilar opacities may reflect mild interstitial edema or infection. Electronically Signed   By: Garald Balding M.D.   On: 02/16/2016 19:27   Ct Head Wo Contrast  Result Date: 02/16/2016 CLINICAL DATA:  Head injury several weeks ago with altered mental status EXAM: CT HEAD WITHOUT CONTRAST TECHNIQUE: Contiguous axial images were obtained from the base of the  skull through the vertex without intravenous contrast. COMPARISON:  11/19/2015 FINDINGS: Brain: No evidence of acute infarction, hemorrhage, hydrocephalus, extra-axial collection or shift. There is a calcified right posterior frontal meningioma measuring 11 mm, stable over multiple years. Cerebral volume loss, moderate in the mesial temporal lobes (implicating Alzheimer's in this patient with dementia history). Moderate chronic microvascular disease with confluent ischemic gliosis. Vascular: No hyperdense vessel or unexpected calcification. Skull: Normal. Negative for fracture or focal lesion. Sinuses/Orbits: No acute finding. Cataract resection seen on the left. IMPRESSION: No acute finding or change compared to prior. Electronically Signed   By: Monte Fantasia M.D.   On: 02/16/2016 16:01    Scheduled Meds: . carbidopa-levodopa  1 tablet Oral BID  . cefTRIAXone (ROCEPHIN)  IV  1 g Intravenous Q24H  . cholecalciferol  2,000 Units Oral Daily  . citalopram  20 mg Oral BH-q7a  . conjugated estrogens  1 Applicatorful Vaginal Q M,W,F-2000  . donepezil  5 mg Oral q morning - 10a  . levothyroxine  137 mcg Oral QAC breakfast  . metoprolol tartrate  25 mg Oral BID  . mirabegron ER  25 mg Oral Daily  . pantoprazole  40 mg Oral BID  . psyllium  1 packet Oral Daily  . QUEtiapine  50 mg Oral QHS    Assessment/Plan:  1. Melena with likely upper GI bleed. Hemoglobin did drift down a little bit with IV fluids. Patient has not had a bowel movement yet. Stop aspirin and continue twice daily PPI. Hopefully can avoid endoscopy. 2. Acute cystitis without hematuria. Continue Rocephin. Awaiting urine culture results. 3. Acute encephalopathy with worsening mental status since head trauma. Possible concussion. CT scan of the head does not show anything acute. This could also worsen with acute cystitis and dehydration. 4. Dehydration. IV fluid given overnight. Creatinine better today. Stop IV fluids. 5. Dementia  without behavioral disturbance. Continue Seroquel at night. Continue Aricept. 6. At the thyroidism unspecified continue levothyroxine. TSH pending. 7. Essential hypertension. Continue metoprolol. Hold Dyazide 8. Appreciate physical therapy evaluation 9. Shortness of breath. Improved today. Better breath sounds today. 10. Social worker evaluation to see if she is able to go back to her assisted living facility or higher level of care.  Code Status:     Code Status Orders        Start     Ordered   02/16/16 1831  Do not attempt resuscitation (DNR)  Continuous    Question Answer Comment  In the event of cardiac or respiratory ARREST Do not call a "code blue"   In the event of cardiac or respiratory ARREST Do not perform Intubation, CPR, defibrillation or ACLS   In the event of cardiac or respiratory ARREST Use medication by any route, position, wound care, and other measures  to relive pain and suffering. May use oxygen, suction and manual treatment of airway obstruction as needed for comfort.   Comments nurse may pronounce      02/16/16 1831    Code Status History    Date Active Date Inactive Code Status Order ID Comments User Context   06/14/2015 11:23 PM 06/19/2015  5:31 PM Full Code [id number]  Hillary Bow, MD ED    Advance Directive Documentation   [place] Most Recent Value  Type of Advance Directive  Living will, Healthcare Power of Attorney  Pre-existing out of facility DNR order (yellow form or pink MOST form)  No data  "MOST" Form in Place?  No data     Family Communication: Spoke with 2 daughters at the bedside Disposition Plan: To be determined based on whether her facility will take her back or not.  Antibiotics:  Rocephin  Time spent: 25 minutes  Tina Gilbert  [employer]

## 2016-02-18 LAB — BASIC METABOLIC PANEL
Anion gap: 6 (ref 5–15)
BUN: 20 mg/dL (ref 6–20)
CALCIUM: 8.5 mg/dL — AB (ref 8.9–10.3)
CO2: 27 mmol/L (ref 22–32)
CREATININE: 1.01 mg/dL — AB (ref 0.44–1.00)
Chloride: 104 mmol/L (ref 101–111)
GFR calc non Af Amer: 48 mL/min — ABNORMAL LOW (ref >60)
GFR, EST AFRICAN AMERICAN: 55 mL/min — AB (ref >60)
Glucose, Bld: 97 mg/dL (ref 65–99)
Potassium: 3.6 mmol/L (ref 3.5–5.1)
SODIUM: 137 mmol/L (ref 135–145)

## 2016-02-18 LAB — HEMOGLOBIN: Hemoglobin: 9.5 g/dL — ABNORMAL LOW (ref 12.0–16.0)

## 2016-02-18 LAB — URINE CULTURE

## 2016-02-18 LAB — OCCULT BLOOD X 1 CARD TO LAB, STOOL: FECAL OCCULT BLD: NEGATIVE

## 2016-02-18 MED ORDER — LEVOTHYROXINE SODIUM 75 MCG PO TABS
150.0000 ug | ORAL_TABLET | Freq: Every day | ORAL | Status: DC
  Administered 2016-02-19: 150 ug via ORAL
  Filled 2016-02-18: qty 2

## 2016-02-18 MED ORDER — SODIUM CHLORIDE 0.9 % IV SOLN
400.0000 mg | Freq: Once | INTRAVENOUS | Status: AC
  Administered 2016-02-18: 400 mg via INTRAVENOUS
  Filled 2016-02-18: qty 20

## 2016-02-18 NOTE — Progress Notes (Signed)
Patient ID: Tina Gilbert, female   DOB: Dec 06, 1924, 82 y.o.   MRN: [id number]  [facility] PROGRESS NOTE  ALYX KEPLEY [id number] DOB: [birth year] DOA: 02/16/2016 PCP: Marcello Fennel, MD  HPI/Subjective: Patient offers no complaints and states she feels fine.  Objective: Vitals:   02/18/16 0821 02/18/16 0915  BP: (!) 127/58 138/62  Pulse: 82 74  Resp: 20   Temp: 97.9 F (36.6 C)     Filed Weights   02/16/16 1412  Weight: 73 kg (161 lb)    ROS: Review of Systems  Unable to perform ROS: Dementia  Respiratory: Negative for shortness of breath.   Cardiovascular: Negative for chest pain.  Gastrointestinal: Negative for abdominal pain.   Limited review of systems with dementia  Exam: Physical Exam  HENT:  Nose: No mucosal edema.  Mouth/Throat: No oropharyngeal exudate or posterior oropharyngeal edema.  Eyes: Conjunctivae and lids are normal. Pupils are equal, round, and reactive to light.  Neck: No JVD present. Carotid bruit is not present. No edema present. No thyroid mass and no thyromegaly present.  Cardiovascular: S1 normal and S2 normal.  Exam reveals no gallop.   Murmur heard.  Systolic murmur is present with a grade of 2/6  Pulses:      Dorsalis pedis pulses are 2+ on the right side, and 2+ on the left side.  Respiratory: No respiratory distress. She has no wheezes. She has no rhonchi. She has no rales.  GI: Soft. Bowel sounds are normal. There is no tenderness.  Musculoskeletal:       Right ankle: She exhibits swelling.       Left ankle: She exhibits swelling.  Lymphadenopathy:    She has no cervical adenopathy.  Neurological: She is alert.  Skin: Skin is warm. No rash noted. Nails show no clubbing.  Psychiatric: She has a normal mood and affect.      Data Reviewed: Basic Metabolic Panel:  Recent Labs Lab 02/16/16 1445 02/17/16 0353 02/18/16 0348  NA 137 138 137  K 3.6 3.6 3.6  CL 100* 104 104  CO2 27 27 27   GLUCOSE 152* 98 97   BUN 21* 18 20  CREATININE 1.36* 0.97 1.01*  CALCIUM 8.9 8.4* 8.5*   Liver Function Tests:  Recent Labs Lab 02/16/16 1445  AST 17  ALT <5*  ALKPHOS 66  BILITOT 0.4  PROT 7.4  ALBUMIN 3.3*    CBC:  Recent Labs Lab 02/16/16 1445 02/16/16 2058 02/17/16 0353 02/18/16 0348  WBC 8.9  --  7.5  --   NEUTROABS 5.4  --   --   --   HGB 10.6* 10.3* 9.4* 9.5*  HCT 31.5*  --  29.1*  --   MCV 96.2  --  97.6  --   PLT 332  --  287  --    Cardiac Enzymes:  Recent Labs Lab 02/16/16 1445  TROPONINI <0.03     Recent Results (from the past 240 hour(s))  Urine culture     Status: Abnormal (Preliminary result)   Collection Time: 02/16/16  3:30 PM  Result Value Ref Range Status   Specimen Description URINE, RANDOM  Final   Special Requests NONE  Final   Culture (A)  Final    >=100,000 COLONIES/mL GRAM NEGATIVE RODS IDENTIFICATION AND SUSCEPTIBILITIES TO FOLLOW Performed at [facility]    Report Status PENDING  Incomplete  MRSA PCR Screening     Status: None   Collection Time: 02/16/16  8:24 PM  Result Value Ref Range Status   MRSA by PCR NEGATIVE NEGATIVE Final    Comment:        The GeneXpert MRSA Assay (FDA approved for NASAL specimens only), is one component of a comprehensive MRSA colonization surveillance program. It is not intended to diagnose MRSA infection nor to guide or monitor treatment for MRSA infections.      Studies: Dg Chest 1 View  Result Date: 02/16/2016 CLINICAL DATA:  Acute onset of shortness of breath, with worsening mental status. Initial encounter. EXAM: CHEST 1 VIEW COMPARISON:  Chest radiograph performed 06/15/2015, and CT of the chest performed 06/18/2015 FINDINGS: The lungs are well-aerated. Mild vascular congestion is noted. Mild bibasilar opacities may reflect mild interstitial edema or infection. There is no evidence of pleural effusion or pneumothorax. The cardiomediastinal silhouette is borderline enlarged. No acute osseous  abnormalities are seen. IMPRESSION: Mild vascular congestion and borderline cardiomegaly. Mild bibasilar opacities may reflect mild interstitial edema or infection. Electronically Signed   By: Garald Balding M.D.   On: 02/16/2016 19:27   Ct Head Wo Contrast  Result Date: 02/16/2016 CLINICAL DATA:  Head injury several weeks ago with altered mental status EXAM: CT HEAD WITHOUT CONTRAST TECHNIQUE: Contiguous axial images were obtained from the base of the skull through the vertex without intravenous contrast. COMPARISON:  11/19/2015 FINDINGS: Brain: No evidence of acute infarction, hemorrhage, hydrocephalus, extra-axial collection or shift. There is a calcified right posterior frontal meningioma measuring 11 mm, stable over multiple years. Cerebral volume loss, moderate in the mesial temporal lobes (implicating Alzheimer's in this patient with dementia history). Moderate chronic microvascular disease with confluent ischemic gliosis. Vascular: No hyperdense vessel or unexpected calcification. Skull: Normal. Negative for fracture or focal lesion. Sinuses/Orbits: No acute finding. Cataract resection seen on the left. IMPRESSION: No acute finding or change compared to prior. Electronically Signed   By: Monte Fantasia M.D.   On: 02/16/2016 16:01    Scheduled Meds: . carbidopa-levodopa  1 tablet Oral BID  . cefTRIAXone  1 g Intravenous Q24H  . cholecalciferol  2,000 Units Oral Daily  . citalopram  20 mg Oral BH-q7a  . conjugated estrogens  1 Applicatorful Vaginal Q M,W,F-2000  . donepezil  5 mg Oral q morning - 10a  . iron sucrose  400 mg Intravenous Once  . [START ON 02/19/2016] levothyroxine  150 mcg Oral QAC breakfast  . metoprolol tartrate  25 mg Oral BID  . mirabegron ER  25 mg Oral Daily  . pantoprazole  40 mg Oral BID  . psyllium  1 packet Oral Daily  . QUEtiapine  50 mg Oral QHS    Assessment/Plan:  1. Melena with likely upper GI bleed. Patient has not had a bowel movement yet. Stop aspirin  and continue twice daily PPI. Hopefully can avoid endoscopy.  Hemoglobin stable. With iron deficiency anemia I will give IV Venofer. 2. Acute cystitis without hematuria. Continue Rocephin. Urine culture growing gram-negative rods. 3. Acute encephalopathy with worsening mental status since head trauma. Possible concussion. CT scan of the head does not show anything acute. This could also worsen with acute cystitis and dehydration. 4. Dehydration. IV fluid given on admission. 5. Dementia without behavioral disturbance. Continue Seroquel at night. Continue Aricept. 6. Hypothyroidism unspecified continue levothyroxine. TSH slightly elevated and I will increase levothyroxine 7. Essential hypertension. Continue metoprolol. Hold Dyazide 8. Appreciate physical therapy evaluation 9. Shortness of breath. Improved today. Better breath sounds today. 10. Social worker evaluation to see if she is able to  go back to her assisted living facility or higher level of care.  The facilities will have to evaluate her on Monday.  Code Status:     Code Status Orders        Start     Ordered   02/16/16 [id number]  Do not attempt resuscitation (DNR)  Continuous    Question Answer Comment  In the event of cardiac or respiratory ARREST Do not call a "code blue"   In the event of cardiac or respiratory ARREST Do not perform Intubation, CPR, defibrillation or ACLS   In the event of cardiac or respiratory ARREST Use medication by any route, position, wound care, and other measures to relive pain and suffering. May use oxygen, suction and manual treatment of airway obstruction as needed for comfort.   Comments nurse may pronounce      02/16/16 1831    Code Status History    Date Active Date Inactive Code Status Order ID Comments User Context   06/14/2015 11:23 PM 06/19/2015  5:31 PM Full Code [id number]  Hillary Bow, MD ED    Advance Directive Documentation   [place] Most Recent Value  Type of Advance Directive   Living will, Healthcare Power of Attorney  Pre-existing out of facility DNR order (yellow form or pink MOST form)  No data  "MOST" Form in Place?  No data     Family Communication: Spoke with daughter at the bedside Disposition Plan: To be determined based on whether her facility will take her back or not.  Antibiotics:  Rocephin  Time spent: 24 minutes  Loletha Grayer  [employer]

## 2016-02-18 NOTE — Care Management Important Message (Signed)
Important Message  Patient Details  Name: Tina Gilbert MRN: [id number] Date of Birth: 17-May-1924   Medicare Important Message Given:  Yes    Tylesha Gibeault A, RN 02/18/2016, 2:52 PM

## 2016-02-19 ENCOUNTER — Encounter: Payer: Self-pay | Admitting: Obstetrics and Gynecology

## 2016-02-19 LAB — BASIC METABOLIC PANEL
Anion gap: 6 (ref 5–15)
BUN: 18 mg/dL (ref 6–20)
CALCIUM: 8.5 mg/dL — AB (ref 8.9–10.3)
CHLORIDE: 103 mmol/L (ref 101–111)
CO2: 26 mmol/L (ref 22–32)
CREATININE: 0.97 mg/dL (ref 0.44–1.00)
GFR calc non Af Amer: 50 mL/min — ABNORMAL LOW (ref >60)
GFR, EST AFRICAN AMERICAN: 58 mL/min — AB (ref >60)
Glucose, Bld: 91 mg/dL (ref 65–99)
Potassium: 3.9 mmol/L (ref 3.5–5.1)
SODIUM: 135 mmol/L (ref 135–145)

## 2016-02-19 LAB — URIC ACID: URIC ACID, SERUM: 5.7 mg/dL (ref 2.3–6.6)

## 2016-02-19 LAB — HEMOGLOBIN: Hemoglobin: 9.4 g/dL — ABNORMAL LOW (ref 12.0–16.0)

## 2016-02-19 MED ORDER — ALPRAZOLAM 0.25 MG PO TABS: 0.2500 mg | ORAL_TABLET | Freq: Two times a day (BID) | ORAL | 0 refills | Status: AC | PRN

## 2016-02-19 MED ORDER — METOPROLOL SUCCINATE ER 25 MG PO TB24: 25.0000 mg | ORAL_TABLET | Freq: Every day | ORAL | 0 refills | Status: AC

## 2016-02-19 MED ORDER — CEPHALEXIN 500 MG PO CAPS: 500.0000 mg | ORAL_CAPSULE | Freq: Two times a day (BID) | ORAL | 0 refills | Status: AC

## 2016-02-19 MED ORDER — LEVOTHYROXINE SODIUM 150 MCG PO TABS: 150.0000 ug | ORAL_TABLET | Freq: Every day | ORAL | 0 refills | Status: AC

## 2016-02-19 MED ORDER — ESTROGENS, CONJUGATED 0.625 MG/GM VA CREA: 1.0000 | TOPICAL_CREAM | Freq: Every day | VAGINAL | 0 refills | Status: DC

## 2016-02-19 MED ORDER — CEPHALEXIN 500 MG PO CAPS
500.0000 mg | ORAL_CAPSULE | Freq: Two times a day (BID) | ORAL | Status: DC
  Administered 2016-02-19: 500 mg via ORAL
  Filled 2016-02-19 (×2): qty 1

## 2016-02-19 MED ORDER — METOPROLOL SUCCINATE ER 25 MG PO TB24: 25.0000 mg | ORAL_TABLET | Freq: Every day | ORAL | Status: DC

## 2016-02-19 MED ORDER — OMEPRAZOLE 20 MG PO CPDR: 20.0000 mg | DELAYED_RELEASE_CAPSULE | Freq: Two times a day (BID) | ORAL | 0 refills | Status: AC

## 2016-02-19 MED ORDER — COLCHICINE 0.6 MG PO TABS
0.6000 mg | ORAL_TABLET | Freq: Once | ORAL | Status: AC
  Administered 2016-02-19: 0.6 mg via ORAL
  Filled 2016-02-19: qty 1

## 2016-02-19 MED ORDER — COLCHICINE 0.6 MG PO TABS: 0.6000 mg | ORAL_TABLET | Freq: Every day | ORAL | 0 refills | Status: AC | PRN

## 2016-02-19 NOTE — Progress Notes (Signed)
Chaplain rounded the patient who was about to be discharged. Pt told chaplain she needed prayers to continue to recover and was excited to hear she will be going home soon. Overall Pt was in good mood. Chaplain provided prayers and presence.     02/19/16 1600  Clinical Encounter Type  Visited With Patient  Visit Type Initial  Spiritual Encounters  Spiritual Needs Prayer

## 2016-02-19 NOTE — Clinical Social Work Note (Signed)
Patient to be d/c'ed today to [facility].  Patient and family agreeable to plans will transport via son's car RN to call report to [facility] [phone].  Evette Cristal, MSW Mon-Fri 8a-4:30p [phone]

## 2016-02-19 NOTE — Progress Notes (Addendum)
Physical Therapy Treatment Patient Details Name: Tina Gilbert MRN: [id number] DOB: 1925/02/24 Today's Date: 02/19/2016    History of Present Illness Tina Gilbert is an 82yo white female who comes to [facility] on10/13 with AMS and new bruising to BUE and face, suspected to have sustained an unwitnessed fall at her facility. Upon admission noted to have melena c GIB and UTI. At baseline she lives at ALF, performing household distances without AD (refuses AD) at supervision level and has had more falls in the last few months. Last three Hb: 10.6 -->10.3-->9.4. PMH includes dementia, PD.     PT Comments    Pt requires encouragement to participate with therapist today however eventually agrees as therapists assists her to sit up at EOB. Pt is very pleasant throughout session following approximately 50% of simple commands. At times she requires redirection as well as hand over hand assistance due to difficulty understanding instructions. Pt is able to ambulate a full lap around RN station and then down hallway and back to room with CGA only. She initially ambulates with a rolling walker however she is able to perform some limited ambulation without an assistive device. Her balance is considerably improved with use of rolling walker. Pt is able to perform seated and supine exercises with therapist requiring heavy cues for correct technique. She is also able to stand at EOB and make her bed without UE support. Per medical record pt has had a progressive decline in her balance with increased frequency of falls. At discharge she will need supervision for all out of bed mobility due to cognitive impairment, decreased safety/insight, and decreased balance. She does not demonstrate a need for SNF placement at this time as her needs are primarily custodial due to chronic worsening of balance and falls. Pt may need higher level of care such as long-term care for safety with mobility if this level of supervision is  unable to be provided consistently at current ALF. This is a conversation to be had between family and current facility. Pt will benefit from skilled PT services to address deficits in strength, balance, and mobility in order to return to full function at home.    Follow Up Recommendations  Home health PT;Supervision for mobility/OOB;Other (comment) (Return to ALF)     Equipment Recommendations  None recommended by PT;Other (comment) (Pt should be encouraged to utilize rolling walker)    Recommendations for Other Services       Precautions / Restrictions Precautions Precautions: None Restrictions Weight Bearing Restrictions: No    Mobility  Bed Mobility Overal bed mobility: Needs Assistance Bed Mobility: Supine to Sit;Sit to Supine     Supine to sit: Min assist Sit to supine: Supervision   General bed mobility comments: Pt requiring minA+1 for supine to sit due to resistance to getting out of bed. She is supervision only to return to bed at end of session. Initially attempts to crawl onto bed forward and requires redirection  Transfers Overall transfer level: Needs assistance Equipment used: None Transfers: Sit to/from Stand Sit to Stand: Min assist;Min guard         General transfer comment: Initially in standing pt requires minA+1 due to instability. At end of session pt requires CGA only for transfers.   Ambulation/Gait Ambulation/Gait assistance: Min guard Ambulation Distance (Feet): 250 Feet Assistive device: 1 person hand held assist;Rolling walker (2 wheeled) Gait Pattern/deviations: Step-through pattern;Decreased step length - right;Decreased step length - left Gait velocity: Decreased Gait velocity interpretation: <1.8 ft/sec, indicative of  risk for recurrent falls General Gait Details: Pt initially unstable during ambulation but easily corrected with rolling walker. Pt agrees to use walker with therapist. By end of ambulation pt able to perform without  assistive device. Pt demonstrates decreased gait speed but functional for facility ambulation. She demonstrates fair scanning of visual environment but requires frequent redirection. Pt complains of intermittent "foot pain" but no grimacing or antalgic gait noted. Pt able to complete a full lap around RN station and then down hallway toward stairwell and back to room. No signs of DOE. Attempted to get vitals however unable to get good signal on pulse oximeter. After walking pt is able to stand at bedside and make bed without UE support   Stairs            Wheelchair Mobility    Modified Rankin (Stroke Patients Only)       Balance Overall balance assessment: Needs assistance Sitting-balance support: No upper extremity supported Sitting balance-Leahy Scale: Good     Standing balance support: No upper extremity supported Standing balance-Leahy Scale: Fair Standing balance comment: Intermittently unstable dynamically without UE support. Easily corrected with rolling walker                    Cognition Arousal/Alertness: Awake/alert Behavior During Therapy: WFL for tasks assessed/performed Overall Cognitive Status: History of cognitive impairments - at baseline (follows simple cues 50% of the time, requires hand over hand. Easily distracted)       Memory: Decreased recall of precautions;Decreased short-term memory              Exercises General Exercises - Lower Extremity Long Arc Quad: Strengthening;Both;10 reps;Seated Heel Slides: Strengthening;Both;10 reps;Supine Hip ABduction/ADduction: Strengthening;Both;10 reps;Seated;Other (comment) (x 10 in supine as well) Straight Leg Raises: Strengthening;Both;10 reps;Supine    General Comments        Pertinent Vitals/Pain Pain Assessment: Faces Faces Pain Scale: No hurt Pain Location: Pt reports that her "feet hurt" while walking but not grimacing or antalgic gait noted Pain Intervention(s): Monitored during  session    Home Living                      Prior Function            PT Goals (current goals can now be found in the care plan section) Acute Rehab PT Goals Patient Stated Goal: Restore quality of gait to PLOF, maintain strenght, reduce falls risk  PT Goal Formulation: With family Time For Goal Achievement: 03/02/16 Potential to Achieve Goals: Fair Progress towards PT goals: Progressing toward goals    Frequency    Min 2X/week      PT Plan Current plan remains appropriate    Co-evaluation             End of Session Equipment Utilized During Treatment: Gait belt Activity Tolerance: Patient tolerated treatment well Patient left: in bed;with bed alarm set;with call bell/phone within reach     Time: 1118-1141 PT Time Calculation (min) (ACUTE ONLY): 23 min  Charges:  $Gait Training: 8-22 mins $Therapeutic Exercise: 8-22 mins                    G Codes:      Lyndel Safe Angelyse Heslin PT, DPT   Latamara Melder 02/19/2016, 12:02 PM

## 2016-02-19 NOTE — Discharge Summary (Signed)
[facility] [place] NAME: Tina Gilbert    MR#:  [id number]  DATE OF BIRTH:  Nov 01, 1941  DATE OF ADMISSION:  02/16/2016 ADMITTING PHYSICIAN: Loletha Grayer, MD  DATE OF DISCHARGE: 02/19/2016  PRIMARY CARE PHYSICIAN: BABAOFF, MARC E, MD    ADMISSION DIAGNOSIS:  Shortness of breath [R06.02] Urinary tract infection without hematuria, site unspecified [N39.0] Altered mental status, unspecified altered mental status type [R41.82]  DISCHARGE DIAGNOSIS:  Active Problems:   GI bleed   SECONDARY DIAGNOSIS:   Past Medical History:  Diagnosis Date  . Acid reflux   . Alzheimer's dementia   . Anemia   . Anxiety   . Anxiety   . Arthritis   . Asymmetrical breasts   . Bladder irritability   . Cystocele    [place] 2 1/4   . DDD (degenerative disc disease), cervical   . Dementia   . GERD (gastroesophageal reflux disease)   . Hematuria   . History of UTI   . Hypertension   . Hypothyroidism   . Meningioma ([place])   . Osteoporosis   . Parkinson disease ([address])   . Parkinson's disease ([place])   . Pessary maintenance   . Rectocele    moderate  . Vaginal atrophy     HOSPITAL COURSE:   1.  Melena with upper GI bleed. Hemoglobin remained relatively stable. I stopped aspirin. Increase PPI to twice a day. No need for endoscopy at this point. First guaiac was positive. Second guaiac was negative. I did give IV Venofer. Continue to watch blood counts every 2 weeks. 2. Acute cystitis without hematuria. Patient was given empiric Rocephin. Urine culture growing Escherichia coli which is sensitive to cefazolin and Rocephin. Switch over to Keflex upon leaving the hospital. 3. Acute encephalopathy with worsening mental status since head trauma. Possible concussion. CT scan of the head does not show anything acute. This could also worsen with acute cystitis and dehydration. 4. Dehydration on admission. We gave IV fluids on admission. 5. Dementia without  behavioral disturbance. Continue Seroquel at night and Aricept. 6. Hypothyroidism unspecified. TSH slightly elevated. Levothyroxine increased. Recommend checking a TSH in 6 weeks. 7. Essential hypertension. Blood pressure variable during the hospital course. This morning little bit on the lower side. Dyazide was discontinued. Changed to Toprol-XL at night. I would recommend checking blood pressure prior to giving this medication if systolic blood pressure less than 115 can hold this medication. 8. Shortness of breath. Improved 9. Right toe pain. Exam not consistent. At some point does have right toe pain and other points doesn't. I will give a dose of culture seen in order when necessary colchicine upon discharge. I will add on a uric acid to her labs this morning. If this is high can consider allopurinol. 80. Family is interested in hospice services at facility which is fine if the facility offers services if not palliative care and follow as outpatient.   DISCHARGE CONDITIONS:  Satisfactory  CONSULTS OBTAINED:  None  DRUG ALLERGIES:   Allergies  Allergen Reactions  . Avelox [Moxifloxacin Hcl In Nacl] Other (See Comments)    Reaction:  Positive ANA and bilateral iritis   . Celebrex [Celecoxib] Other (See Comments)    Reaction:  Positive ANA and bilateral iritis   . Memantine Other (See Comments)    Confusion, AMS  . Memantine Hcl Other (See Comments)    confusion  . Oxybutynin Chloride Other (See Comments)    Patient's daughter states she gets  severe confusion.  . Tramadol Other (See Comments)    CONFUSION, FALLING Reaction:  Hallucinations     DISCHARGE MEDICATIONS:   Current Discharge Medication List    START taking these medications   Details  cephALEXin (KEFLEX) 500 MG capsule Take 1 capsule (500 mg total) by mouth every 12 (twelve) hours. Qty: 8 capsule, Refills: 0    colchicine 0.6 MG tablet Take 1 tablet (0.6 mg total) by mouth daily as needed (toe pain). Qty: 10  tablet, Refills: 0    metoprolol succinate (TOPROL-XL) 25 MG 24 hr tablet Take 1 tablet (25 mg total) by mouth at bedtime. Qty: 30 tablet, Refills: 0      CONTINUE these medications which have CHANGED   Details  ALPRAZolam (XANAX) 0.25 MG tablet Take 1 tablet (0.25 mg total) by mouth 2 (two) times daily as needed. For agitation. Qty: 30 tablet, Refills: 0    conjugated estrogens (PREMARIN) vaginal cream Place 1 Applicatorful vaginally daily. Insert 0.5mg  (pea-sized amount)  On Monday, Wednesday and Friday nights. Qty: 30 g, Refills: 0   Associated Diagnoses: Vaginal atrophy    levothyroxine (SYNTHROID, LEVOTHROID) 150 MCG tablet Take 1 tablet (150 mcg total) by mouth daily before breakfast. Qty: 30 tablet, Refills: 0    omeprazole (PRILOSEC) 20 MG capsule Take 1 capsule (20 mg total) by mouth 2 (two) times daily before a meal. Reported on 06/13/2038 Qty: 60 capsule, Refills: 0      CONTINUE these medications which have NOT CHANGED   Details  acetaminophen (TYLENOL) 650 MG CR tablet Take 650 mg by mouth 3 (three) times daily as needed for pain.    carbidopa-levodopa (SINEMET CR) 50-200 MG per tablet Take 1 tablet by mouth 2 (two) times daily.    Carboxymeth-Glycerin-Polysorb (REFRESH OPTIVE ADVANCED) 0.5-1-0.5 % SOLN Apply 1 drop to eye 4 (four) times daily as needed. For constipation.    Cholecalciferol (VITAMIN D3) 2000 units capsule Take 2,000 Units by mouth daily.    citalopram (CELEXA) 20 MG tablet Take 20 mg by mouth every morning. Reported on 06/13/2038    donepezil (ARICEPT) 5 MG tablet Take 5 mg by mouth every morning.    mirabegron ER (MYRBETRIQ) 25 MG TB24 tablet Take 25 mg by mouth daily.     nystatin ointment (MYCOSTATIN) Apply 1 application topically 2 (two) times daily as needed.     POLYETHYLENE GLYCOL 3350 PO Take 17 g by mouth daily as needed. For constipation. *Mix in 4-8 oz of fluid*    QUEtiapine (SEROQUEL) 50 MG tablet Take 50 mg by mouth at bedtime.     triamcinolone ointment (KENALOG) 0.1 % Apply 1 application topically 2 (two) times daily. Qty: 30 g, Refills: 0    TRIMO-SAN 0.025 % GEL USE AS DIRECTED Qty: 113.4 g, Refills: 3    Wheat Dextrin (BENEFIBER DRINK MIX PO) Take 1 Dose by mouth daily. Reported on 06/13/2038      STOP taking these medications     aspirin (GOODSENSE ASPIRIN) 81 MG chewable tablet      metoprolol tartrate (LOPRESSOR) 25 MG tablet      triamterene-hydrochlorothiazide (MAXZIDE-25) 37.5-25 MG tablet          DISCHARGE INSTRUCTIONS:   Follow up PMD one week  If you experience worsening of your admission symptoms, develop shortness of breath, life threatening emergency, suicidal or homicidal thoughts you must seek medical attention immediately by calling 911 or calling your MD immediately  if symptoms less severe.  You Must read complete  instructions/literature along with all the possible adverse reactions/side effects for all the Medicines you take and that have been prescribed to you. Take any new Medicines after you have completely understood and accept all the possible adverse reactions/side effects.   Please note  You were cared for by a hospitalist during your hospital stay. If you have any questions about your discharge medications or the care you received while you were in the hospital after you are discharged, you can call the unit and asked to speak with the hospitalist on call if the hospitalist that took care of you is not available. Once you are discharged, your primary care physician will handle any further medical issues. Please note that NO REFILLS for any discharge medications will be authorized once you are discharged, as it is imperative that you return to your primary care physician (or establish a relationship with a primary care physician if you do not have one) for your aftercare needs so that they can reassess your need for medications and monitor your lab values.    Today   CHIEF  COMPLAINT:   Chief Complaint  Patient presents with  . Fall    HISTORY OF PRESENT ILLNESS:  Jeniah Lacharite  is a 82 y.o. female with a known history of Dementia presented after a fall and altered mental status   VITAL SIGNS:  Blood pressure (!) 109/44, pulse 90, temperature 98.7 F (37.1 C), temperature source Oral, resp. rate (!) 24, weight 73 kg (161 lb), SpO2 94 %. Respirations by me documented at 18.    PHYSICAL EXAMINATION:  GENERAL:  82 y.o.-year-old patient lying in the bed with no acute distress.  EYES: Pupils equal, round, reactive to light and accommodation. No scleral icterus. Extraocular muscles intact.  HEENT: Head atraumatic, normocephalic. Oropharynx and nasopharynx clear.  NECK:  Supple, no jugular venous distention. No thyroid enlargement, no tenderness.  LUNGS: Normal breath sounds bilaterally, no wheezing, rales,rhonchi or crepitation. No use of accessory muscles of respiration.  CARDIOVASCULAR: S1, S2 normal. 4-6 systolic murmurs, no rubs, or gallops.  ABDOMEN: Soft, non-tender, non-distended. Bowel sounds present. No organomegaly or mass.  EXTREMITIES: No pedal edema, cyanosis, or clubbing.  NEUROLOGIC: Cranial nerves II through XII are intact. Muscle strength 5/5 in all extremities. Sensation intact. Gait not checked.  PSYCHIATRIC: The patient is alert.  SKIN: No obvious rash, lesion, or ulcer.   DATA REVIEW:   CBC  Recent Labs Lab 02/17/16 0353  02/19/16 0412  WBC 7.5  --   --   HGB 9.4*  < > 9.4*  HCT 29.1*  --   --   PLT 287  --   --   < > = values in this interval not displayed.  Chemistries   Recent Labs Lab 02/16/16 1445  02/19/16 0412  NA 137  < > 135  K 3.6  < > 3.9  CL 100*  < > 103  CO2 27  < > 26  GLUCOSE 152*  < > 91  BUN 21*  < > 18  CREATININE 1.36*  < > 0.97  CALCIUM 8.9  < > 8.5*  AST 17  --   --   ALT <5*  --   --   ALKPHOS 66  --   --   BILITOT 0.4  --   --   < > = values in this interval not displayed.  Cardiac  Enzymes  Recent Labs Lab 02/16/16 [place] <0.03    Microbiology Results  Results for orders placed or performed during the hospital encounter of 02/16/16  Urine culture     Status: Abnormal   Collection Time: 02/16/16  3:30 PM  Result Value Ref Range Status   Specimen Description URINE, RANDOM  Final   Special Requests NONE  Final   Culture >=100,000 COLONIES/mL ESCHERICHIA COLI (A)  Final   Report Status 02/18/2016 FINAL  Final   Organism ID, Bacteria ESCHERICHIA COLI (A)  Final      Susceptibility   Escherichia coli - MIC*    AMPICILLIN 4 SENSITIVE Sensitive     CEFAZOLIN <=4 SENSITIVE Sensitive     CEFTRIAXONE <=1 SENSITIVE Sensitive     CIPROFLOXACIN >=4 RESISTANT Resistant     GENTAMICIN <=1 SENSITIVE Sensitive     IMIPENEM 1 SENSITIVE Sensitive     NITROFURANTOIN <=16 SENSITIVE Sensitive     TRIMETH/SULFA >=320 RESISTANT Resistant     AMPICILLIN/SULBACTAM 4 SENSITIVE Sensitive     PIP/TAZO <=4 SENSITIVE Sensitive     Extended ESBL NEGATIVE Sensitive     * >=100,000 COLONIES/mL ESCHERICHIA COLI  MRSA PCR Screening     Status: None   Collection Time: 02/16/16  8:24 PM  Result Value Ref Range Status   MRSA by PCR NEGATIVE NEGATIVE Final    Comment:        The GeneXpert MRSA Assay (FDA approved for NASAL specimens only), is one component of a comprehensive MRSA colonization surveillance program. It is not intended to diagnose MRSA infection nor to guide or monitor treatment for MRSA infections.     Management plans discussed with the patient, family and they are in agreement.  CODE STATUS:     Code Status Orders        Start     Ordered   02/16/16 1831  Do not attempt resuscitation (DNR)  Continuous    Question Answer Comment  In the event of cardiac or respiratory ARREST Do not call a "code blue"   In the event of cardiac or respiratory ARREST Do not perform Intubation, CPR, defibrillation or ACLS   In the event of cardiac or respiratory  ARREST Use medication by any route, position, wound care, and other measures to relive pain and suffering. May use oxygen, suction and manual treatment of airway obstruction as needed for comfort.   Comments nurse may pronounce      02/16/16 1831    Code Status History    Date Active Date Inactive Code Status Order ID Comments User Context   06/14/2015 11:23 PM 06/19/2015  5:31 PM Full Code [id number]  Hillary Bow, MD ED    Advance Directive Documentation   [place] Most Recent Value  Type of Advance Directive  Living will, Healthcare Power of Attorney  Pre-existing out of facility DNR order (yellow form or pink MOST form)  No data  "MOST" Form in Place?  No data      TOTAL TIME TAKING CARE OF THIS PATIENT: 35 minutes.    Loletha Grayer M.D on 02/19/2016 at 10:12 AM  Between 7am to 6pm - Pager - [phone]  After 6pm go to [email or web address] - password [email or web address]  [facility] Office  [phone]  CC: Primary care physician; BABAOFF, Caryl Bis, MD

## 2016-02-19 NOTE — NC FL2 (Signed)
[place] LEVEL OF CARE SCREENING TOOL     IDENTIFICATION  Patient Name: Tina Gilbert Birthdate: 1925-03-06 Sex: female Admission Date (Current Location): 02/16/2016  [place] and [place] Number:  Engineering geologist and Address:  [facility], [address], [place], [place] [address]      Provider Number: [id number]  Attending Physician Name and Address:  Loletha Grayer, MD  Relative Name and Phone Number:     [name] Daughter [phone]       Current Level of Care: Hospital Recommended Level of Care: [place] Prior Approval Number:    Date Approved/Denied:   PASRR Number:  pending  Discharge Plan: SNF    Current Diagnoses: Patient Active Problem List   Diagnosis Date Noted  . GI bleed 02/16/2016  . Atrophic vaginitis 09/25/2015  . UTI (lower urinary tract infection) 06/14/2015  . Cystocele 12/02/2014  . Rectocele 12/02/2014  . Abnormal brain scan 09/10/2013  . Difficulty walking 09/10/2013  . Amnesia 09/10/2013  . 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 Parkinson's disease ([place]) 09/10/2013  . Acid reflux 09/03/2013  . Benign essential HTN 09/03/2013  . Hyperlipidemia, unspecified 09/03/2013  . Hypothyroidism 09/03/2013    Orientation RESPIRATION BLADDER Height & Weight     Self  Normal Incontinent Weight: 161 lb (73 kg) Height:     BEHAVIORAL SYMPTOMS/MOOD NEUROLOGICAL BOWEL NUTRITION STATUS      Continent Diet (Regular diet)  AMBULATORY STATUS COMMUNICATION OF NEEDS Skin   Limited Assist Verbally Normal                       Personal Care Assistance Level of Assistance  Bathing, Dressing Bathing Assistance: Limited assistance   Dressing Assistance: Limited assistance     Functional Limitations Info  Sight, Hearing, Speech Sight Info: Adequate Hearing Info: Adequate Speech Info: Adequate    SPECIAL CARE FACTORS FREQUENCY  PT (By licensed PT)     PT  Frequency: 5x a week              Contractures Contractures Info: Not present    Additional Factors Info  Allergies, Psychotropic   Allergies Info: AVELOX MOXIFLOXACIN HCL IN NACL, CELEBREX CELECOXIB, MEMANTINE, MEMANTINE HCL, OXYBUTYNIN CHLORIDE, TRAMADOL Psychotropic Info: citalopram (CELEXA) tablet 20 mg, QUEtiapine (SEROQUEL) tablet 50 mg         Current Medications (02/19/2016):  This is the current hospital active medication list Current Facility-Administered Medications  Medication Dose Route Frequency Provider Last Rate Last Dose  . acetaminophen (TYLENOL) tablet 650 mg  650 mg Oral Q6H PRN Loletha Grayer, MD       Or  . acetaminophen (TYLENOL) suppository 650 mg  650 mg Rectal Q6H PRN Loletha Grayer, MD      . ALPRAZolam Duanne Moron) tablet 0.25 mg  0.25 mg Oral BID PRN Loletha Grayer, MD   0.25 mg at 02/18/16 2310  . carbidopa-levodopa (SINEMET CR) 50-200 MG per tablet controlled release 1 tablet  1 tablet Oral BID Loletha Grayer, MD   1 tablet at 02/19/16 1004  . cephALEXin (KEFLEX) capsule 500 mg  500 mg Oral Q12H Loletha Grayer, MD   500 mg at 02/19/16 1100  . cholecalciferol (VITAMIN D) tablet 2,000 Units  2,000 Units Oral Daily Loletha Grayer, MD   2,000 Units at 02/19/16 [phone]  . citalopram (CELEXA) tablet 20 mg  20 mg Oral Delsa Bern, MD   20 mg at 02/19/16 0557  . conjugated  estrogens (PREMARIN) vaginal cream 1 Applicatorful  1 Applicatorful Vaginal Q M,W,F-2000 Loletha Grayer, MD   1 Applicatorful at [phone]  . donepezil (ARICEPT) tablet 5 mg  5 mg Oral q morning - 10a Loletha Grayer, MD   5 mg at 02/19/16 0955  . levothyroxine (SYNTHROID, LEVOTHROID) tablet 150 mcg  150 mcg Oral QAC breakfast Loletha Grayer, MD   150 mcg at 02/19/16 0848  . metoprolol succinate (TOPROL-XL) 24 hr tablet 25 mg  25 mg Oral QHS Loletha Grayer, MD      . mirabegron ER [facility]) tablet 25 mg  25 mg Oral Daily Loletha Grayer, MD   25 mg at 02/19/16 0955  .  nystatin ointment (MYCOSTATIN) 1 application  1 application Topical BID PRN Loletha Grayer, MD      . pantoprazole (PROTONIX) EC tablet 40 mg  40 mg Oral BID Loletha Grayer, MD   40 mg at 02/19/16 1004  . polyethylene glycol (MIRALAX / GLYCOLAX) packet 17 g  17 g Oral Daily PRN Loletha Grayer, MD      . polyvinyl alcohol (LIQUIFILM TEARS) 1.4 % ophthalmic solution 1 drop  1 drop Both Eyes QID PRN Loletha Grayer, MD      . psyllium (HYDROCIL/METAMUCIL) packet 1 packet  1 packet Oral Daily Loletha Grayer, MD   1 packet at 02/19/16 [phone]  . QUEtiapine (SEROQUEL) tablet 50 mg  50 mg Oral QHS Loletha Grayer, MD   50 mg at 02/18/16 2311     Discharge Medications: Please see discharge summary for a list of discharge medications.  Relevant Imaging Results:  Relevant Lab Results:   Additional Information SSN [id number]  Ross Ludwig

## 2016-02-19 NOTE — Progress Notes (Signed)
Patient ID: Tina Gilbert, female   DOB: 1924/08/25, 82 y.o.   MRN: [id number]  [facility] PROGRESS NOTE  Tina Gilbert [id number] DOB: Sep 25, 1924 DOA: 02/16/2016 PCP: Marcello Fennel, MD  HPI/Subjective: Daughter states that she has right toe pain and was not walking very well. Patient complained of pain when I palpated the right first toe on some occasions but not all.  Objective: Vitals:   02/19/16 1000 02/19/16 1055  BP: (!) 123/55 (!) 135/45  Pulse: 84 80  Resp:  16  Temp:  98.7 F (37.1 C)    Filed Weights   02/16/16 1412  Weight: 73 kg (161 lb)    ROS: Review of Systems  Unable to perform ROS: Dementia  Respiratory: Negative for shortness of breath.   Cardiovascular: Negative for chest pain.  Gastrointestinal: Negative for abdominal pain.  Musculoskeletal: Positive for joint pain.   Limited review of systems with dementia  Exam: Physical Exam  HENT:  Nose: No mucosal edema.  Mouth/Throat: No oropharyngeal exudate or posterior oropharyngeal edema.  Eyes: Conjunctivae and lids are normal. Pupils are equal, round, and reactive to light.  Neck: No JVD present. Carotid bruit is not present. No edema present. No thyroid mass and no thyromegaly present.  Cardiovascular: S1 normal and S2 normal.  Exam reveals no gallop.   Murmur heard.  Systolic murmur is present with a grade of 2/6  Pulses:      Dorsalis pedis pulses are 2+ on the right side, and 2+ on the left side.  Respiratory: No respiratory distress. She has no wheezes. She has no rhonchi. She has no rales.  GI: Soft. Bowel sounds are normal. There is no tenderness.  Musculoskeletal:       Right ankle: She exhibits swelling.       Left ankle: She exhibits swelling.  Right toe pain occasionally when pressing and moving it.  Lymphadenopathy:    She has no cervical adenopathy.  Neurological: She is alert.  Skin: Skin is warm. No rash noted. Nails show no clubbing.  Psychiatric: She has a normal  mood and affect.      Data Reviewed: Basic Metabolic Panel:  Recent Labs Lab 02/16/16 1445 02/17/16 0353 02/18/16 0348 02/19/16 0412  NA 137 138 137 135  K 3.6 3.6 3.6 3.9  CL 100* 104 104 103  CO2 27 27 27 26   GLUCOSE 152* 98 97 91  BUN 21* 18 20 18   CREATININE 1.36* 0.97 1.01* 0.97  CALCIUM 8.9 8.4* 8.5* 8.5*   Liver Function Tests:  Recent Labs Lab 02/16/16 1445  AST 17  ALT <5*  ALKPHOS 66  BILITOT 0.4  PROT 7.4  ALBUMIN 3.3*    CBC:  Recent Labs Lab 02/16/16 1445 02/16/16 2058 02/17/16 0353 02/18/16 0348 02/19/16 0412  WBC 8.9  --  7.5  --   --   NEUTROABS 5.4  --   --   --   --   HGB 10.6* 10.3* 9.4* 9.5* 9.4*  HCT 31.5*  --  29.1*  --   --   MCV 96.2  --  97.6  --   --   PLT 332  --  287  --   --    Cardiac Enzymes:  Recent Labs Lab 02/16/16 1445  TROPONINI <0.03     Recent Results (from the past 240 hour(s))  Urine culture     Status: Abnormal   Collection Time: 02/16/16  3:30 PM  Result Value Ref Range Status  Specimen Description URINE, RANDOM  Final   Special Requests NONE  Final   Culture >=100,000 COLONIES/mL ESCHERICHIA COLI (A)  Final   Report Status 02/18/2016 FINAL  Final   Organism ID, Bacteria ESCHERICHIA COLI (A)  Final      Susceptibility   Escherichia coli - MIC*    AMPICILLIN 4 SENSITIVE Sensitive     CEFAZOLIN <=4 SENSITIVE Sensitive     CEFTRIAXONE <=1 SENSITIVE Sensitive     CIPROFLOXACIN >=4 RESISTANT Resistant     GENTAMICIN <=1 SENSITIVE Sensitive     IMIPENEM 1 SENSITIVE Sensitive     NITROFURANTOIN <=16 SENSITIVE Sensitive     TRIMETH/SULFA >=320 RESISTANT Resistant     AMPICILLIN/SULBACTAM 4 SENSITIVE Sensitive     PIP/TAZO <=4 SENSITIVE Sensitive     Extended ESBL NEGATIVE Sensitive     * >=100,000 COLONIES/mL ESCHERICHIA COLI  MRSA PCR Screening     Status: None   Collection Time: 02/16/16  8:24 PM  Result Value Ref Range Status   MRSA by PCR NEGATIVE NEGATIVE Final    Comment:        The  GeneXpert MRSA Assay (FDA approved for NASAL specimens only), is one component of a comprehensive MRSA colonization surveillance program. It is not intended to diagnose MRSA infection nor to guide or monitor treatment for MRSA infections.      Scheduled Meds: . carbidopa-levodopa  1 tablet Oral BID  . cephALEXin  500 mg Oral Q12H  . cholecalciferol  2,000 Units Oral Daily  . citalopram  20 mg Oral BH-q7a  . conjugated estrogens  1 Applicatorful Vaginal Q M,W,F-2000  . donepezil  5 mg Oral q morning - 10a  . levothyroxine  150 mcg Oral QAC breakfast  . metoprolol succinate  25 mg Oral QHS  . mirabegron ER  25 mg Oral Daily  . pantoprazole  40 mg Oral BID  . psyllium  1 packet Oral Daily  . QUEtiapine  50 mg Oral QHS    Assessment/Plan:  1. Melena with likely upper GI bleed. Repeat guiac is nagative.  Stop aspirin and continue twice daily PPI.  No need for procedure. 2. Acute cystitis without hematuria. Switch to keflex 3. Acute encephalopathy with worsening mental status since head trauma. Possible concussion. CT scan of the head does not show anything acute. 4. Dehydration. IV fluid given on admission. 5. Dementia without behavioral disturbance. Continue Seroquel at night. Continue Aricept. 6. Hypothyroidism unspecified continue levothyroxine. TSH slightly elevated and I increased levothyroxine 7. Essential hypertension. Change to Toprol XL at night. Hold Dyazide 8. Appreciate physical therapy evaluation 9. Shortness of breath. Improved. 10. Toe pain. Gave colchicine. Uric acid normal range. Not sure if gout or not. No antinflamtories with melana.  Code Status:     Code Status Orders        Start     Ordered   02/16/16 [id number]  Do not attempt resuscitation (DNR)  Continuous    Question Answer Comment  In the event of cardiac or respiratory ARREST Do not call a "code blue"   In the event of cardiac or respiratory ARREST Do not perform Intubation, CPR, defibrillation or  ACLS   In the event of cardiac or respiratory ARREST Use medication by any route, position, wound care, and other measures to relive pain and suffering. May use oxygen, suction and manual treatment of airway obstruction as needed for comfort.   Comments nurse may pronounce      02/16/16 1831  Code Status History    Date Active Date Inactive Code Status Order ID Comments User Context   06/14/2015 11:23 PM 06/19/2015  5:31 PM Full Code [id number]  Hillary Bow, MD ED    Advance Directive Documentation   [place] Most Recent Value  Type of Advance Directive  Living will, Healthcare Power of Attorney  Pre-existing out of facility DNR order (yellow form or pink MOST form)  No data  "MOST" Form in Place?  No data     Family Communication: Spoke with daughter at the bedside Disposition Plan: To facility, hopefully today. Discharge order written.  Antibiotics:  Keflex  Time spent: 35 minutes  Loletha Grayer  [employer]

## 2016-02-19 NOTE — Clinical Social Work Note (Signed)
MSW spoke to [facility] and they can not accept patient back because they feel she needs more care then the ALF can provide.  MSW faxed patient's clinicals to SNFs for placement.  Patient is a level 2 passar screen, MSW is awaiting passar number from [place] before patient is able to discharge.  [facility] can accept patient once Passar number has been received.  MSW to facilitate discharge planning once Passar number is assigned.  Jones Broom. Lane Eland, MSW [phone]  Mon-Fri 8a-4:30p 02/19/2016 1:32 PM

## 2016-02-19 NOTE — Progress Notes (Signed)
Pt prepared for d/c to [facility]. IV d/c'd. Skin intact except as charted in most recent assessments. Vitals are stable. Report called to receiving facility. Pt to be transported by family and escorted out by NT.  Angus Seller

## 2016-02-21 ENCOUNTER — Encounter: Payer: Self-pay | Admitting: Urology

## 2016-02-29 ENCOUNTER — Encounter: Payer: Self-pay | Admitting: Urology

## 2016-02-29 ENCOUNTER — Ambulatory Visit: Payer: Medicare Other | Admitting: Urology

## 2016-02-29 VITALS — BP 144/82 | HR 86 | Ht 62.0 in | Wt 157.6 lb

## 2016-02-29 DIAGNOSIS — N952 Postmenopausal atrophic vaginitis: Secondary | ICD-10-CM

## 2016-02-29 DIAGNOSIS — N390 Urinary tract infection, site not specified: Secondary | ICD-10-CM

## 2016-02-29 DIAGNOSIS — R32 Unspecified urinary incontinence: Secondary | ICD-10-CM | POA: Diagnosis not present

## 2016-02-29 LAB — BLADDER SCAN AMB NON-IMAGING: Scan Result: 45

## 2016-02-29 MED ORDER — ESTROGENS, CONJUGATED 0.625 MG/GM VA CREA: 1.0000 | TOPICAL_CREAM | Freq: Every day | VAGINAL | 12 refills | Status: AC

## 2016-02-29 MED ORDER — NITROFURANTOIN MONOHYD MACRO 100 MG PO CAPS: 100.0000 mg | ORAL_CAPSULE | Freq: Two times a day (BID) | ORAL | 0 refills | Status: AC

## 2016-02-29 MED ORDER — MIRABEGRON ER 25 MG PO TB24: 25.0000 mg | ORAL_TABLET | Freq: Every day | ORAL | 12 refills | Status: AC

## 2016-02-29 NOTE — Progress Notes (Signed)
02/29/2016 12:09 PM   Tina Gilbert 03-Nov-1924 [id number]  Referring provider: Derinda Late, MD [phone] [facility] [place], [place] [address]  Chief Complaint  Patient presents with  . Follow-up    3 week follow up recurrent uti and urinary incontinence    HPI: Patient is a 82 -year-old Caucasian female who presents with her daughter, Tina Gilbert for a 3 week follow up after starting Myrbetriq 25 mg daily.    Background history Patient was referred to [place] by, Dr. Baldemar Lenis, for recurrent urinary tract infections.  Patient has Alzheimer and history is obtained from her daughter, Tina Gilbert.  Patient's daughter states that she has had several urinary tract infections over the last year.   Her symptoms with a urinary tract infection consist of increased confusion, possibly scooting her bottom across the floor and aggressive behavior.  Her daughter has not noted any complaints of gross hematuria, suprapubic pain, back pain, abdominal pain or flank pain.  She has not had any recent fevers, chills, nausea or vomiting.   She does not have a history of nephrolithiasis, GU surgery or GU trauma.  Reviewing her records,  she has had 6 documented UTI's.  These were not cath specimens.  Patient had been on suppressive Macrobid since May 2017.     + E.coli on 04/06/2015  + E.coli on 05/25/2015  + E.coli on 06/14/2015 with sepsis  + Klebsiella pneumoniae on 11/16/2015  + E.coli on 09/03/2015  + E.coli on 01/18/2016   She is not sexually active.  She is post menopausal.   She denies constipation and/or diarrhea.  She does not engage in good perineal hygiene. She does not take tub baths.  She does have incontinence.  She is using incontinence pads.  She is not having pain with bladder filling.   RUS 06/2015 w/o stones or hydro.  Cysto 2017 was unremarkable.  She is not drinking a lot of water daily.  Patient's daughter is wanting an urine culture to confirm that  she has recovered from her last infection., it was negative.  Her UA today was unremarkable.  Her PVR was 53 mL.  She is having her pessary cleaned every 6 weeks with gynecology.  Patient is having vaginal estrogen cream applying twice weekly.  She is also taking Vesicare.   Since her last visit with [place], she has been hospitalized for rectal bleeding and an UTI.  She was found to have an E.coli infection and a GI bleed.  Due to her age and co [place] ties, she has been made DNR and hospice has been brought on board.  She has also had another infection since returning from the hospital.  Daughter states that her SNIF did obtain a CATH UA for urine culture.  Microscopically it had > 10 epithelial cells.    Her daughter is unsure if the Myrbetriq has made any difference.  Her PVR today was 45 mL.   PMH: Past Medical History:  Diagnosis Date  . Acid reflux   . Alzheimer's dementia   . Anemia   . Anxiety   . Anxiety   . Arthritis   . Asymmetrical breasts   . Bladder irritability   . Cystocele    g[place] 2 1/4   . DDD (degenerative disc disease), cervical   . Dementia   . GERD (gastroesophageal reflux disease)   . Hematuria   . History of UTI   . Hypertension   . Hypothyroidism   .  Meningioma ([place])   . Osteoporosis   . Parkinson disease ([place])   . Parkinson's disease ([place])   . Pessary maintenance   . Rectocele    moderate  . Vaginal atrophy     Surgical History: Past Surgical History:  Procedure Laterality Date  . ABDOMINAL HYSTERECTOMY    . CARPAL TUNNEL RELEASE Bilateral   . COLONOSCOPY WITH PROPOFOL N/A 11/28/2014   Procedure: COLONOSCOPY WITH PROPOFOL;  Surgeon: Hulen Luster, MD;  Location: [facility] ENDOSCOPY;  Service: Gastroenterology;  Laterality: N/A;  . ESOPHAGOGASTRODUODENOSCOPY (EGD) WITH PROPOFOL N/A 11/28/2014   Procedure: ESOPHAGOGASTRODUODENOSCOPY (EGD) WITH PROPOFOL;  Surgeon: Hulen Luster, MD;  Location: [facility] ENDOSCOPY;  Service: Gastroenterology;  Laterality: N/A;  . OVARIAN  MASS REMOVAL    . TOTAL THYROIDECTOMY      Home Medications:    Medication List       Accurate as of 02/29/16 12:09 PM. Always use your most recent med list.          acetaminophen 650 MG CR tablet Commonly known as:  TYLENOL Take 650 mg by mouth 3 (three) times daily as needed for pain.   ALPRAZolam 0.25 MG tablet Commonly known as:  XANAX Take 1 tablet (0.25 mg total) by mouth 2 (two) times daily as needed. For agitation.   BENEFIBER DRINK MIX PO Take 1 Dose by mouth daily. Reported on 06/14/2015   carbidopa-levodopa 50-200 MG tablet Commonly known as:  SINEMET CR Take 1 tablet by mouth 2 (two) times daily.   cephALEXin 500 MG capsule Commonly known as:  KEFLEX Take 1 capsule (500 mg total) by mouth every 12 (twelve) hours.   citalopram 20 MG tablet Commonly known as:  CELEXA Take 20 mg by mouth every morning. Reported on 06/14/2015   colchicine 0.6 MG tablet Take 1 tablet (0.6 mg total) by mouth daily as needed (toe pain).   conjugated estrogens vaginal cream Commonly known as:  PREMARIN Place 1 Applicatorful vaginally daily. Insert 0.5mg  (pea-sized amount)  On Monday, Wednesday and Friday nights.   donepezil 5 MG tablet Commonly known as:  ARICEPT Take 5 mg by mouth every morning.   levothyroxine 150 MCG tablet Commonly known as:  SYNTHROID, LEVOTHROID Take 1 tablet (150 mcg total) by mouth daily before breakfast.   metoprolol succinate 25 MG 24 hr tablet Commonly known as:  TOPROL-XL Take 1 tablet (25 mg total) by mouth at bedtime.   MYRBETRIQ 25 MG Tb24 tablet Generic drug:  mirabegron ER Take 25 mg by mouth daily.   nystatin ointment Commonly known as:  MYCOSTATIN Apply 1 application topically 2 (two) times daily as needed.   omeprazole 20 MG capsule Commonly known as:  PRILOSEC Take 1 capsule (20 mg total) by mouth 2 (two) times daily before a meal. Reported on 06/14/2015   POLYETHYLENE GLYCOL 3350 PO Take 17 g by mouth daily as needed. For  constipation. *Mix in 4-8 oz of fluid*   QUEtiapine 50 MG tablet Commonly known as:  SEROQUEL Take 50 mg by mouth at bedtime.   REFRESH OPTIVE ADVANCED 0.5-1-0.5 % Soln Generic drug:  Carboxymeth-Glycerin-Polysorb Apply 1 drop to eye 4 (four) times daily as needed. For constipation.   triamcinolone ointment 0.1 % Commonly known as:  KENALOG Apply 1 application topically 2 (two) times daily.   TRIMO-SAN 0.025 % Gel Generic drug:  OXYQUINOLONE SULFATE VAGINAL USE AS DIRECTED   Vitamin D3 2000 units capsule Take 2,000 Units by mouth daily.       Allergies:  Allergies  Allergen Reactions  . Avelox [Moxifloxacin Hcl In Nacl] Other (See Comments)    Reaction:  Positive ANA and bilateral iritis   . Celebrex [Celecoxib] Other (See Comments)    Reaction:  Positive ANA and bilateral iritis   . Memantine Other (See Comments)    Confusion, AMS  . Memantine Hcl Other (See Comments)    confusion  . Oxybutynin Chloride Other (See Comments)    Patient's daughter states she gets severe confusion.  . Tramadol Other (See Comments)    CONFUSION, FALLING Reaction:  Hallucinations     Family History: Family History  Problem Relation Age of Onset  . CAD    . Kidney cancer Father   . CVA Mother   . CVA Sister   . Lung cancer Brother   . Bladder Cancer Neg Hx   . Prostate cancer Neg Hx     Social History:  reports that she has never smoked. She has never used smokeless tobacco. She reports that she does not drink alcohol or use drugs.  ROS: UROLOGY Frequent Urination?: Yes Hard to postpone urination?: No Burning/pain with urination?: No Get up at night to urinate?: No Leakage of urine?: Yes Urine stream starts and stops?: No Trouble starting stream?: No Do you have to strain to urinate?: No Blood in urine?: No Urinary tract infection?: Yes Sexually transmitted disease?: No Injury to kidneys or bladder?: No Painful intercourse?: No Weak stream?: No Currently pregnant?:  No Vaginal bleeding?: No Last menstrual period?: n  Gastrointestinal Nausea?: No Vomiting?: No Indigestion/heartburn?: No Diarrhea?: No Constipation?: No  Constitutional Fever: No Night sweats?: No Weight loss?: No Fatigue?: No  Skin Skin rash/lesions?: No Itching?: No  Eyes Blurred vision?: No Double vision?: No  Ears/Nose/Throat Sore throat?: No Sinus problems?: No  Hematologic/Lymphatic Swollen glands?: No Easy bruising?: No  Cardiovascular Leg swelling?: No Chest pain?: No  Respiratory Cough?: No Shortness of breath?: Yes  Endocrine Excessive thirst?: No  Musculoskeletal Back pain?: No Joint pain?: No  Neurological Headaches?: No Dizziness?: No  Psychologic Depression?: Yes Anxiety?: No  Physical Exam: BP (!) 144/82   Pulse 86   Ht 5\' 2"  (1.575 m)   Wt 157 lb 9.6 oz (71.5 kg)   BMI 28.83 kg/m   Constitutional: Well nourished. Alert and oriented, No acute distress. HEENT: [place] AT, moist mucus membranes. Trachea midline, no masses. Cardiovascular: No clubbing, cyanosis, or edema. Respiratory: Normal respiratory effort, no increased work of breathing. Skin: No rashes, bruises or suspicious lesions. Lymph: No cervical or inguinal adenopathy. Neurologic: Grossly intact, no focal deficits, moving all 4 extremities. Psychiatric: Normal mood and affect.  Laboratory Data: Lab Results  Component Value Date   WBC 7.5 02/17/2016   HGB 9.4 (L) 02/19/2016   HCT 29.1 (L) 02/17/2016   MCV 97.6 02/17/2016   PLT 287 02/17/2016    Lab Results  Component Value Date   CREATININE 0.97 02/19/2016     Lab Results  Component Value Date   AST 17 02/16/2016   Lab Results  Component Value Date   ALT <5 (L) 02/16/2016    Pertinent imaging Results for Tina, Gilbert (MRN [id number]) as of 03/03/2016 23:42  Ref. Range 02/29/2016 12:02  Scan Result Unknown 45   Assessment & Plan:    1. Recurrent UTI  - I have extended the Macrobid  prescription from 5 days to 7 days  - I have asked the SNIF to obtain a catheterized specimen once the Macrobid is completed  -  I have also asked that the type of epithelial cells seen on microanalysis be identified  2. Incontinence  - patient with Alzheimer, with discontinue the Vesicare- as anticholinergics can cause mental status changes  - continue Myrbetriq 25 mg, samples given     - Bladder Scan (Post Void Residual) in office  - RTC prn  3. Vaginal atrophy  - increase vaginal estrogen cream to three nights weekly  - RTC prn  4. Cystocele  - managed by gynecology  No Follow-up on file.  These notes generated with voice recognition software. I apologize for typographical errors.  Zara Council, [facility] [address], [place], [place] [address] [phone]

## 2016-03-19 NOTE — Telephone Encounter (Signed)
Patient's daughter just called to let you know that she was at end of life care with hospice and not expected to live much longer. She wanted you to be aware of this.   Thanks,  Sharyn Lull

## 2016-04-04 ENCOUNTER — Encounter: Payer: Medicare Other | Admitting: Obstetrics and Gynecology

## 2016-04-05 DEATH — deceased

## 2017-01-22 IMAGING — DX DG WRIST COMPLETE 3+V*R*
4 series · 4 of 4 positions shown · non-contrast
Comparison: None.

CLINICAL DATA: Recent fall with wrist pain, initial encounter

EXAM:
RIGHT WRIST - COMPLETE 3+ VIEW

[wrist pa (1 of 2)]
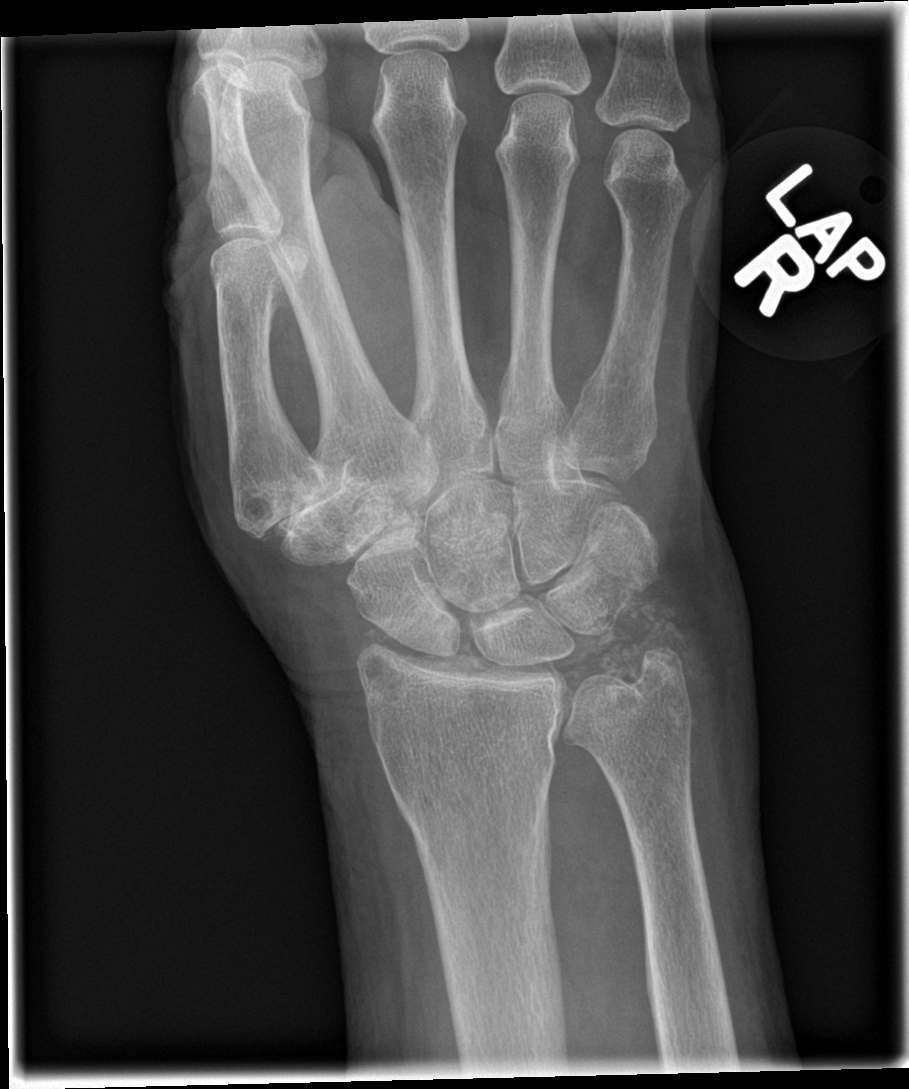

[wrist obl]
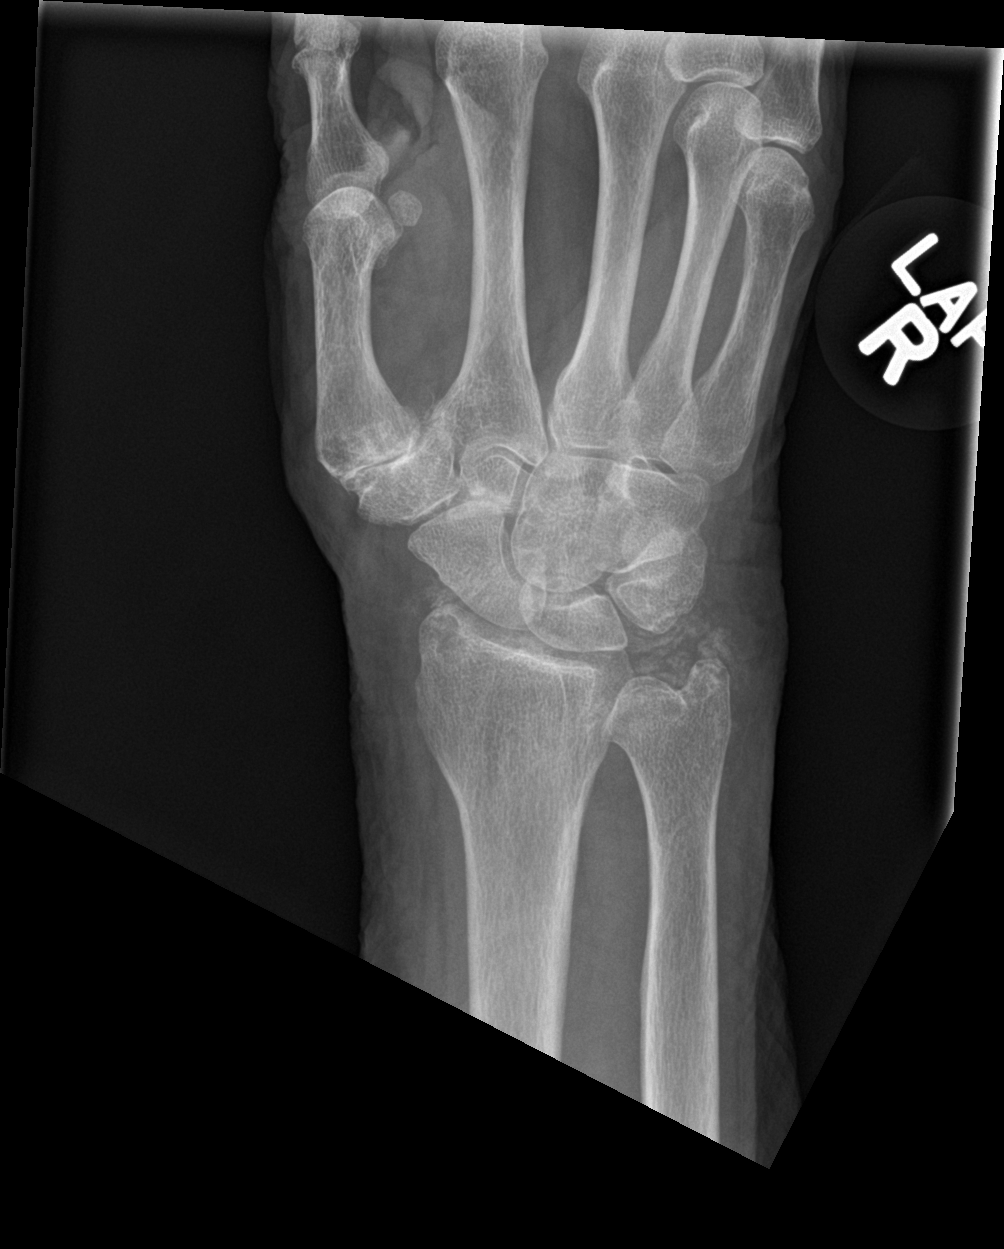

[wrist lat]
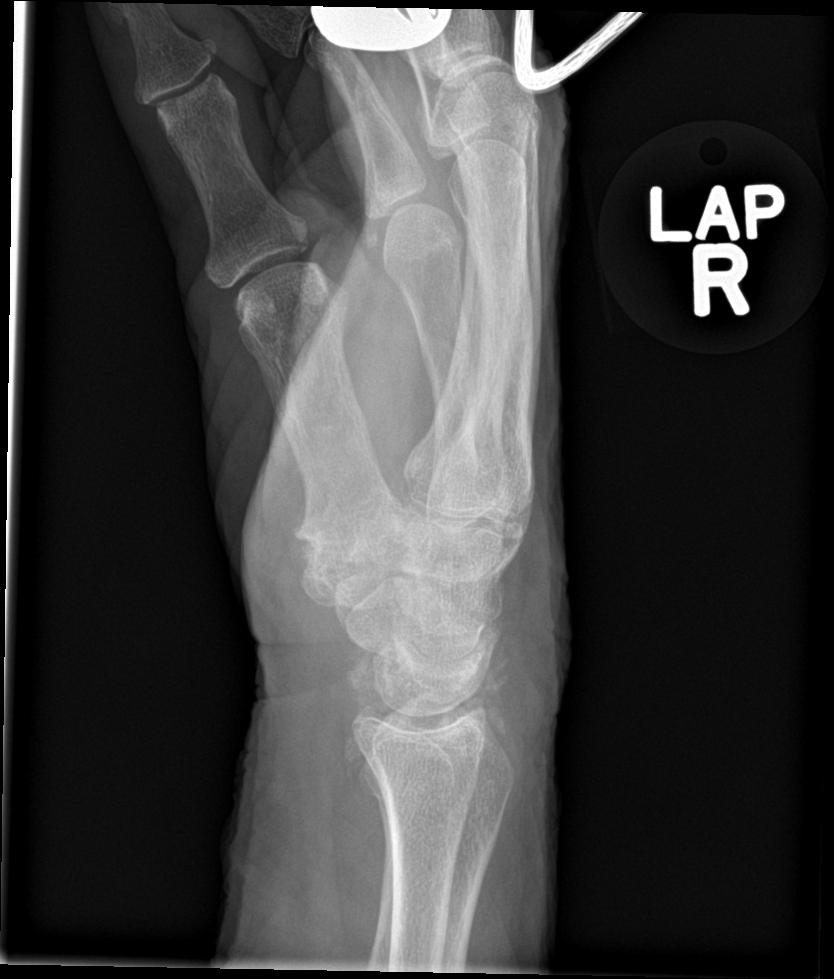

[wrist pa (2 of 2)]
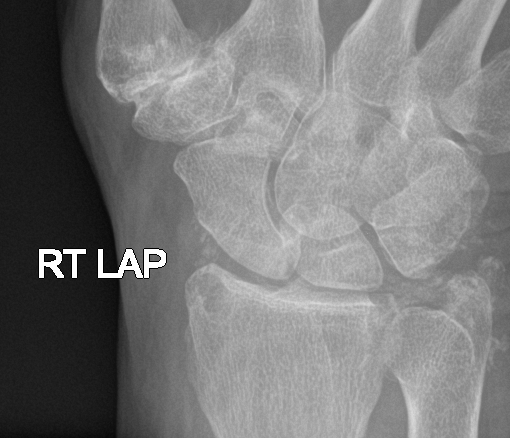

[4 of 4 positions shown; findings below may reference images not displayed]

FINDINGS: Degenerative changes are noted at the first carpometacarpal
articulation. Soft tissue calcifications are noted about the wrist
joint. No acute fracture or dislocation is seen. Mild degenerative
changes in the radiocarpal and radioulnar articulations are seen.
Some widening of the scapholunate space is seen likely related to
prior ligamentous injury.
IMPRESSION: Degenerative changes without acute abnormality.

## 2017-09-07 IMAGING — CR DG ANKLE COMPLETE 3+V*R*
1 series · 3 of 3 positions shown · non-contrast
Comparison: None.

CLINICAL DATA: Right ankle pain today.  No known injury.

EXAM:
RIGHT ANKLE - COMPLETE 3+ VIEW

[Series 1: ap · 0.17mm/px · 3 of 3 slices shown]
[im 1/3]
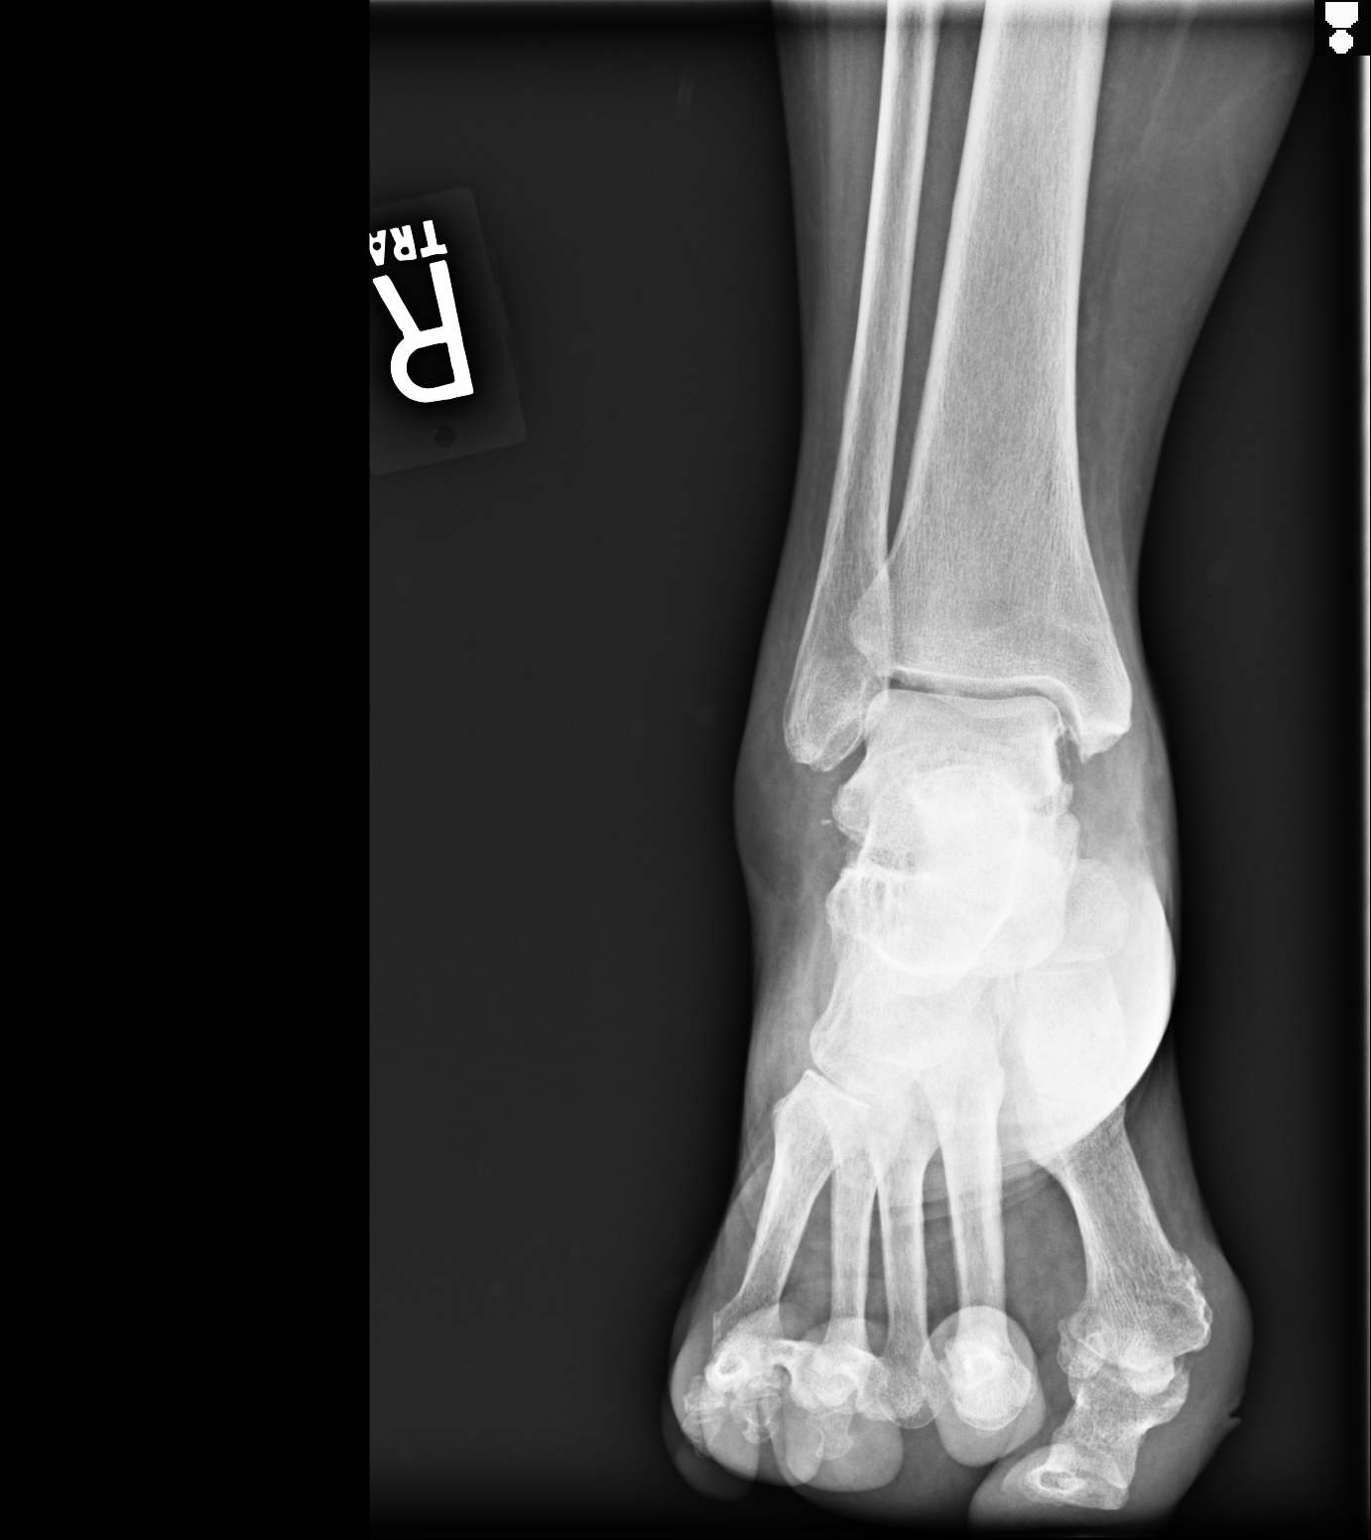
[im 2/3]
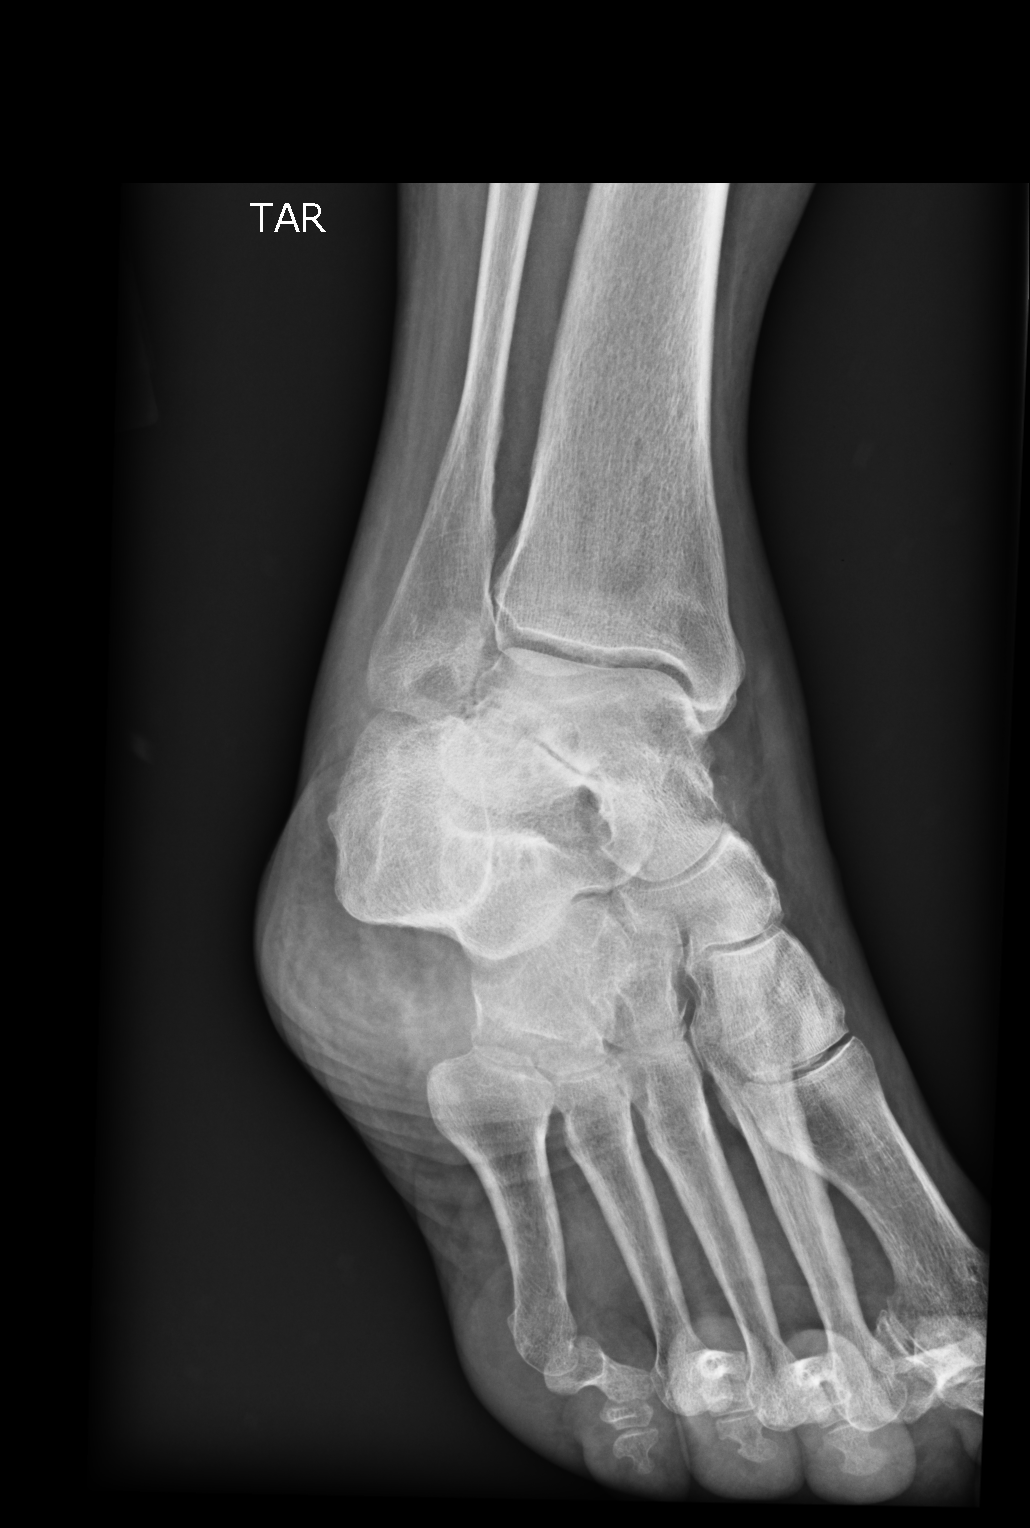
[im 3/3]
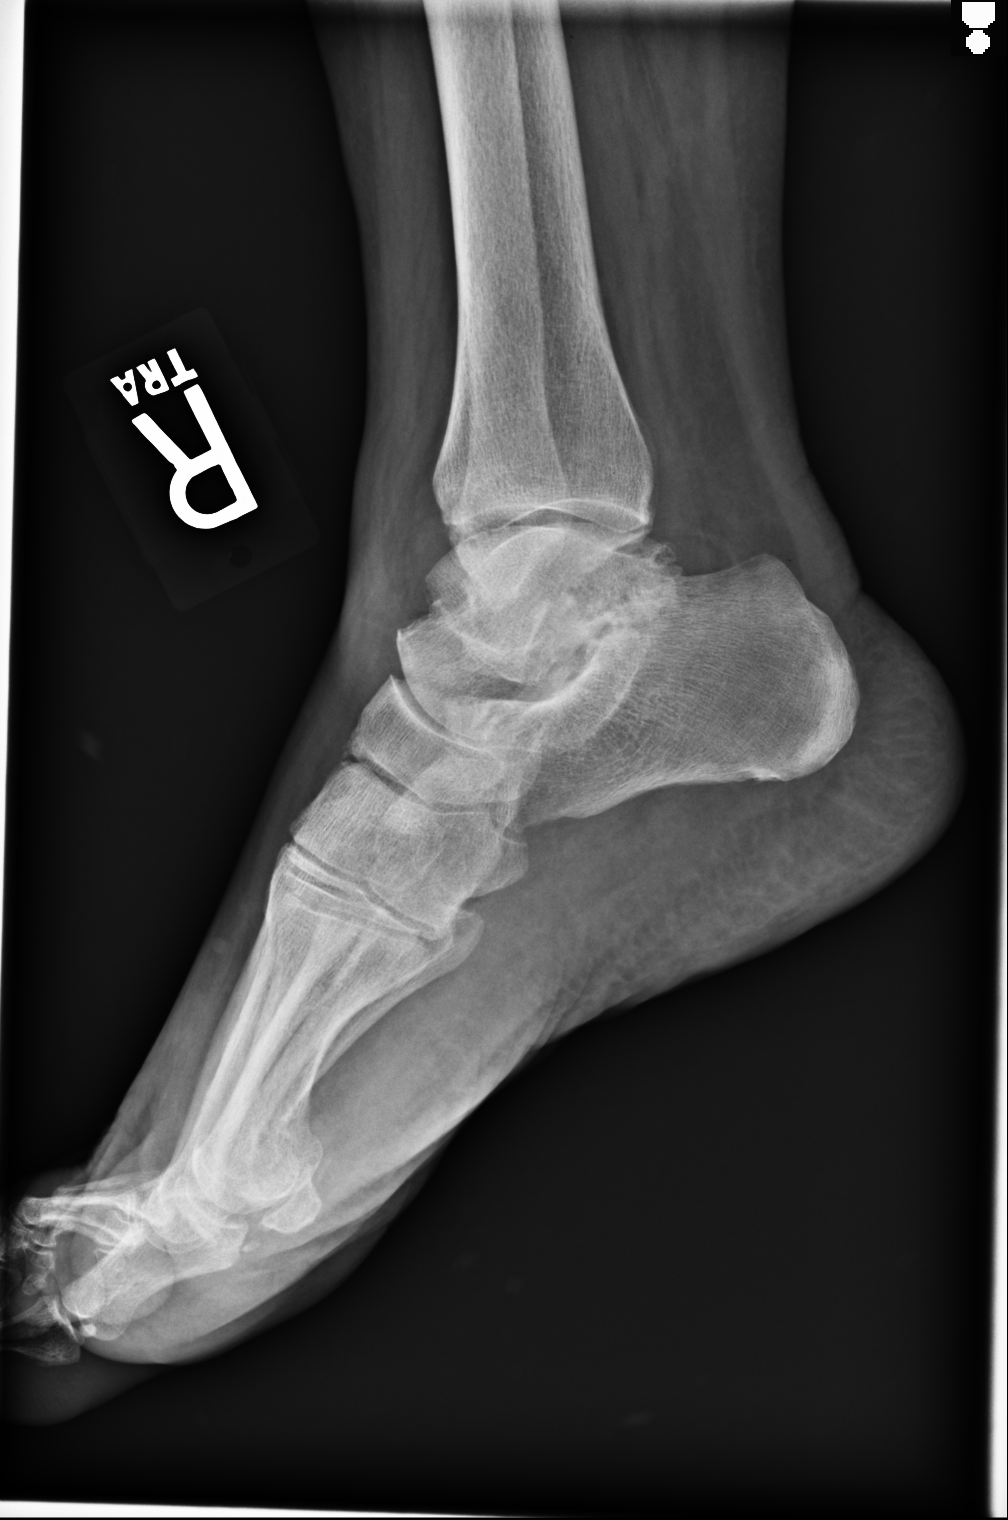

[3 of 3 positions shown; findings below may reference images not displayed]

FINDINGS: Moderate tibiotalar joint degenerative changes with mild joint space
narrowing and osteophytic spurring. There is more significant
subtalar joint degenerative changes particularly involving the
posterior talocalcaneal facet. No acute bony abnormality.
IMPRESSION: Moderate tibiotalar and more significant subtalar joint degenerative
changes but no definite acute bony abnormality.

## 2017-10-06 IMAGING — CR DG CHEST 1V PORT
1 series · 1 of 1 positions shown · non-contrast
Comparison: April 02, 2015.

CLINICAL DATA: Fever, altered mental status.

EXAM:
PORTABLE CHEST 1 VIEW

[ap]
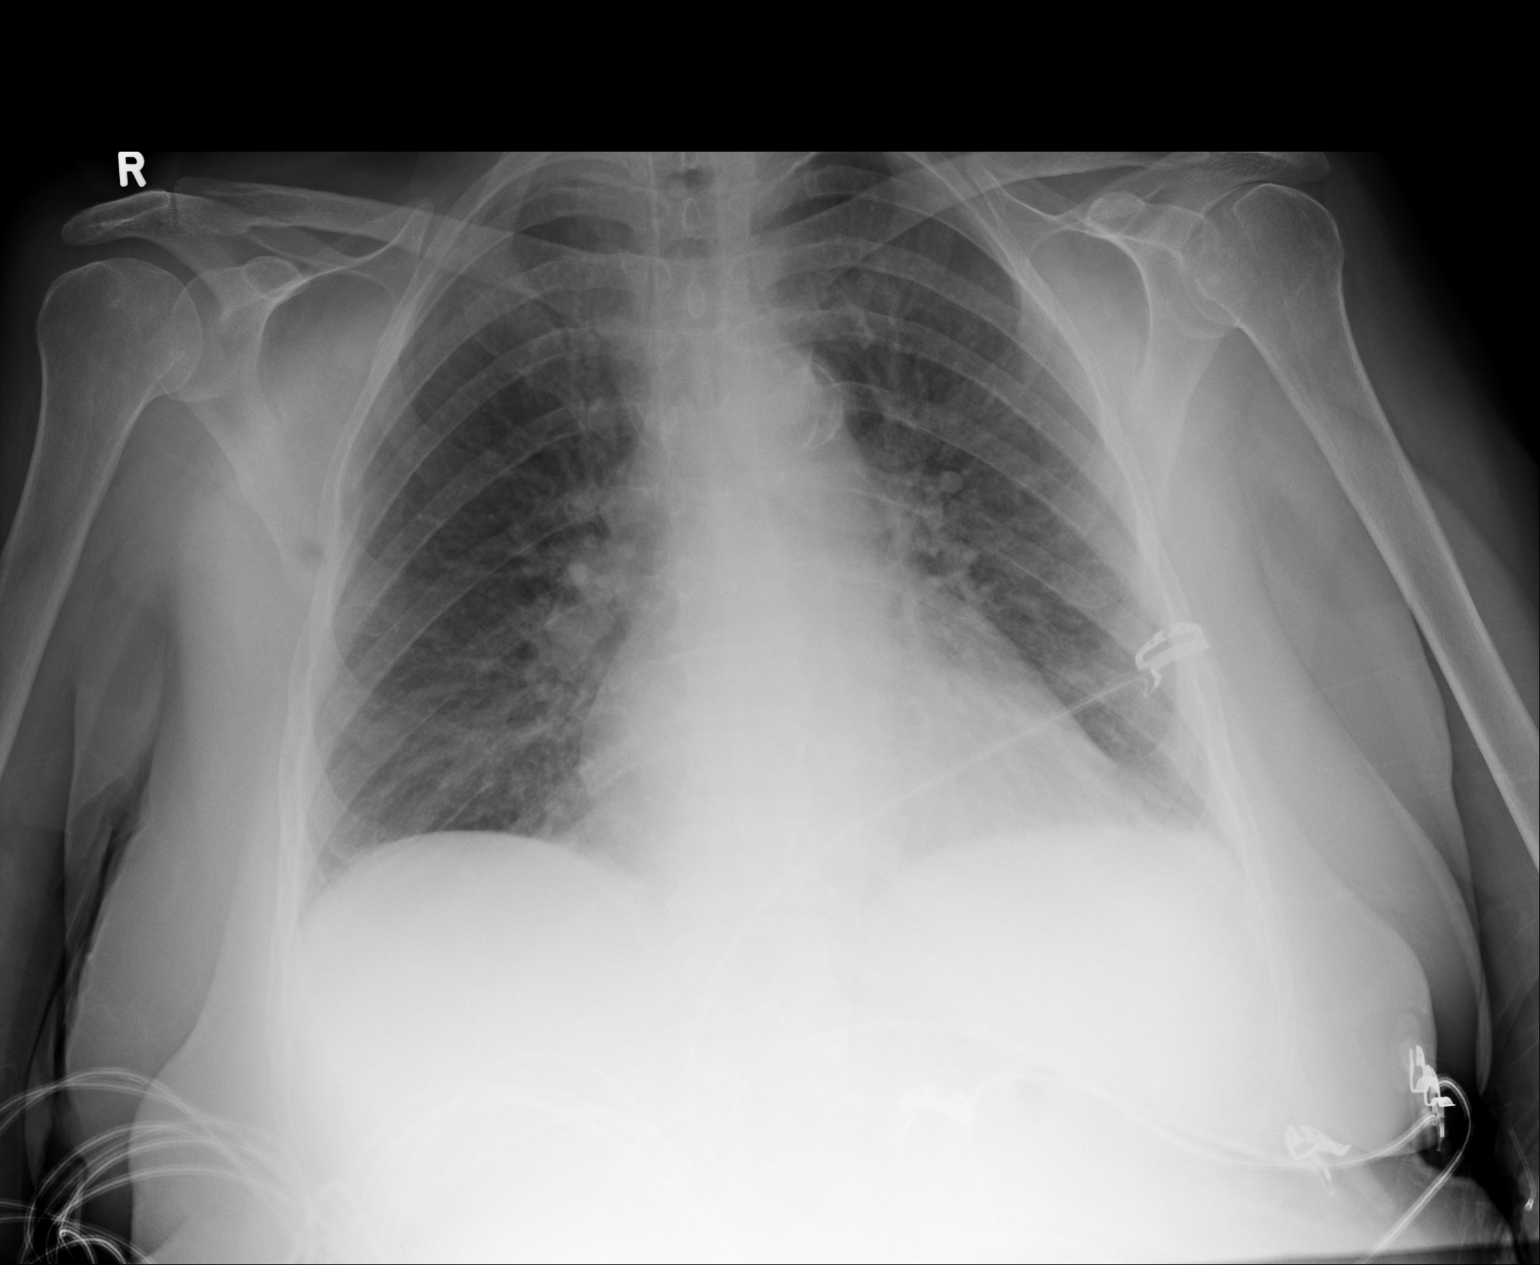

[1 of 1 positions shown; findings below may reference images not displayed]

FINDINGS: The heart size and mediastinal contours are within normal limits.
Both lungs are clear. No pneumothorax or pleural effusion is noted.
The visualized skeletal structures are unremarkable.
IMPRESSION: No acute cardiopulmonary abnormality seen.
